# Patient Record
Sex: Male | Born: 1995 | Race: White | Hispanic: Yes | Marital: Single | Smoking: Former smoker
Health system: Southern US, Community
[De-identification: ages and names within clinical notes are randomized; demographics above are authoritative.]

## PROBLEM LIST (undated history)

## (undated) DIAGNOSIS — Z21 Asymptomatic human immunodeficiency virus [HIV] infection status: Secondary | ICD-10-CM

## (undated) DIAGNOSIS — B20 Human immunodeficiency virus [HIV] disease: Secondary | ICD-10-CM

---
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
## 2017-01-31 ENCOUNTER — Telehealth: Payer: Self-pay

## 2017-01-31 ENCOUNTER — Ambulatory Visit: Payer: Self-pay | Admitting: Internal Medicine

## 2017-01-31 NOTE — Telephone Encounter (Signed)
Called both numbers on file to contact patient regarding missed appointments. Several attempts made by [facility].  Patient may need some bridge counseling. Towanda OctaveLanatra Kaavya Puskarich, LPN

## 2017-04-04 ENCOUNTER — Other Ambulatory Visit: Payer: Self-pay

## 2017-04-04 ENCOUNTER — Other Ambulatory Visit: Payer: Self-pay | Admitting: Behavioral Health

## 2017-04-04 DIAGNOSIS — Z79899 Other long term (current) drug therapy: Secondary | ICD-10-CM

## 2017-04-04 DIAGNOSIS — Z113 Encounter for screening for infections with a predominantly sexual mode of transmission: Secondary | ICD-10-CM

## 2017-04-04 DIAGNOSIS — B20 Human immunodeficiency virus [HIV] disease: Secondary | ICD-10-CM

## 2017-04-05 LAB — COMPREHENSIVE METABOLIC PANEL
AG RATIO: 1.3 (calc) (ref 1.0–2.5)
ALBUMIN MSPROF: 4.7 g/dL (ref 3.6–5.1)
ALT: 14 U/L (ref 8–46)
AST: 19 U/L (ref 12–32)
Alkaline phosphatase (APISO): 66 U/L (ref 48–230)
BILIRUBIN TOTAL: 0.6 mg/dL (ref 0.2–1.1)
BUN / CREAT RATIO: 23 (calc) — AB (ref 6–22)
BUN: 22 mg/dL — AB (ref 7–20)
CALCIUM: 9.3 mg/dL (ref 8.9–10.4)
CO2: 23 mmol/L (ref 20–32)
Chloride: 100 mmol/L (ref 98–110)
Creat: 0.94 mg/dL (ref 0.60–1.26)
GLUCOSE: 84 mg/dL (ref 65–99)
Globulin: 3.6 g/dL (calc) — ABNORMAL HIGH (ref 2.1–3.5)
POTASSIUM: 3.8 mmol/L (ref 3.8–5.1)
SODIUM: 136 mmol/L (ref 135–146)
TOTAL PROTEIN: 8.3 g/dL — AB (ref 6.3–8.2)

## 2017-04-05 LAB — LIPID PANEL
CHOLESTEROL: 133 mg/dL (ref <170)
HDL: 66 mg/dL (ref >45)
LDL Cholesterol (Calc): 54 mg/dL (calc) (ref <110)
Non-HDL Cholesterol (Calc): 67 mg/dL (calc) (ref <120)
Total CHOL/HDL Ratio: 2 (calc) (ref <5.0)
Triglycerides: 45 mg/dL (ref <90)

## 2017-04-05 LAB — CBC WITH DIFFERENTIAL/PLATELET
BASOS ABS: 21 {cells}/uL (ref 0–200)
Basophils Relative: 0.3 %
EOS ABS: 57 {cells}/uL (ref 15–500)
Eosinophils Relative: 0.8 %
HEMATOCRIT: 45.6 % (ref 38.5–50.0)
HEMOGLOBIN: 16.1 g/dL (ref 13.2–17.1)
LYMPHS ABS: 1740 {cells}/uL (ref 850–3900)
MCH: 31.1 pg (ref 27.0–33.0)
MCHC: 35.3 g/dL (ref 32.0–36.0)
MCV: 88.2 fL (ref 80.0–100.0)
MPV: 11.7 fL (ref 7.5–12.5)
Monocytes Relative: 8.2 %
NEUTROS PCT: 66.2 %
Neutro Abs: 4700 cells/uL (ref 1500–7800)
Platelets: 196 10*3/uL (ref 140–400)
RBC: 5.17 10*6/uL (ref 4.20–5.80)
RDW: 13.2 % (ref 11.0–15.0)
Total Lymphocyte: 24.5 %
WBC mixed population: 582 cells/uL (ref 200–950)
WBC: 7.1 10*3/uL (ref 3.8–10.8)

## 2017-04-05 LAB — T-HELPER CELL (CD4) - (RCID CLINIC ONLY)
CD4 T CELL ABS: 430 /uL (ref 400–2700)
CD4 T CELL HELPER: 24 % — AB (ref 33–55)

## 2017-04-05 LAB — RPR: RPR: NONREACTIVE

## 2017-04-06 ENCOUNTER — Encounter: Payer: Self-pay | Admitting: Internal Medicine

## 2017-04-06 ENCOUNTER — Other Ambulatory Visit: Payer: Self-pay | Admitting: Internal Medicine

## 2017-04-06 LAB — HIV-1 RNA QUANT-NO REFLEX-BLD
HIV 1 RNA Quant: <20 copies/mL — AB
HIV-1 RNA Quant, Log: <1.3 Log copies/mL — AB

## 2017-04-06 LAB — URINE CYTOLOGY ANCILLARY ONLY
Chlamydia: POSITIVE — AB
NEISSERIA GONORRHEA: NEGATIVE

## 2017-04-06 LAB — CYTOLOGY, (ORAL, ANAL, URETHRAL) ANCILLARY ONLY
CHLAMYDIA, DNA PROBE: NEGATIVE
NEISSERIA GONORRHEA: NEGATIVE

## 2017-04-06 MED ORDER — AZITHROMYCIN 500 MG PO TABS: 1000.0000 mg | ORAL_TABLET | Freq: Once | ORAL | 0 refills | Status: AC

## 2017-04-08 ENCOUNTER — Telehealth: Payer: Self-pay | Admitting: *Deleted

## 2017-04-08 NOTE — Telephone Encounter (Signed)
Patient called for lab results. RN relayed Dr Ephriam Knucklesomer's message regarding chlamydia result and treatment, advised patient to let his partners know they should be tested/treated.  Azithromycin 1000 mg was sent to [facility] on [address] over the weekend.  Patient will pick up today. RN advised patient to keep his follow up appointment next week for full lab review with Dr Luciana Axeomer. Andree CossHowell, Emilija Bohman M, RN

## 2017-04-18 ENCOUNTER — Ambulatory Visit (INDEPENDENT_AMBULATORY_CARE_PROVIDER_SITE_OTHER): Payer: Self-pay | Admitting: Internal Medicine

## 2017-04-18 ENCOUNTER — Encounter: Payer: Self-pay | Admitting: Internal Medicine

## 2017-04-18 VITALS — BP 127/79 | HR 67 | Temp 98.5°F | Ht 68.0 in | Wt 135.0 lb

## 2017-04-18 DIAGNOSIS — B20 Human immunodeficiency virus [HIV] disease: Secondary | ICD-10-CM

## 2017-04-18 DIAGNOSIS — Z113 Encounter for screening for infections with a predominantly sexual mode of transmission: Secondary | ICD-10-CM

## 2017-04-18 DIAGNOSIS — R634 Abnormal weight loss: Secondary | ICD-10-CM

## 2017-04-18 DIAGNOSIS — Z5181 Encounter for therapeutic drug level monitoring: Secondary | ICD-10-CM

## 2017-04-18 MED ORDER — DOCUSATE SODIUM 100 MG PO CAPS: 100.0000 mg | ORAL_CAPSULE | Freq: Two times a day (BID) | ORAL | 2 refills | Status: DC

## 2017-04-18 NOTE — Assessment & Plan Note (Addendum)
Doing well on current regimen. He can rtc  Months.

## 2017-04-18 NOTE — Assessment & Plan Note (Signed)
+   chlamydia and he took the treatment I sent last week. Will rescreen now.

## 2017-04-18 NOTE — Progress Notes (Signed)
   Subjective:    Patient ID: Danny [name], male    DOB: 12-13-1995, 22 y.o.   MRN: [id number]  HPI Here for follow up of HIV He was a new patient last year and started on Biktarvy.  He was off about 2 months after his first month due to some confusion on getting it.  He though has been on it consistently since.  CD4 430 and viral load < 20.  He did have some burning with urination recently.  No current penile discharge.  + chlamydia.    Review of Systems  Constitutional: Negative for fatigue.  Skin: Negative for rash.  Neurological: Negative for dizziness.       Objective:   Physical Exam  Constitutional: He appears well-developed and well-nourished. No distress.  HENT:  Mouth/Throat: No oropharyngeal exudate.  Eyes: No scleral icterus.  Cardiovascular: Normal rate, regular rhythm and normal heart sounds.  No murmur heard. Pulmonary/Chest: Effort normal and breath sounds normal. No respiratory distress.  Skin: No rash noted.   SH: sexually active       Assessment & Plan:

## 2017-04-18 NOTE — Assessment & Plan Note (Signed)
Some more loss but able to eat.  I suspect he has some element of IBS.  Will try stool softener and see if that helps improve things.

## 2017-04-18 NOTE — Assessment & Plan Note (Signed)
Creat, LFTs wnl.  

## 2017-04-19 LAB — CYTOLOGY, (ORAL, ANAL, URETHRAL) ANCILLARY ONLY
CHLAMYDIA, DNA PROBE: NEGATIVE
Chlamydia: NEGATIVE
NEISSERIA GONORRHEA: NEGATIVE
Neisseria Gonorrhea: NEGATIVE

## 2017-04-19 LAB — URINE CYTOLOGY ANCILLARY ONLY
CHLAMYDIA, DNA PROBE: NEGATIVE
NEISSERIA GONORRHEA: NEGATIVE

## 2017-07-15 ENCOUNTER — Other Ambulatory Visit: Payer: Self-pay | Admitting: Pharmacist

## 2017-07-15 DIAGNOSIS — B20 Human immunodeficiency virus [HIV] disease: Secondary | ICD-10-CM

## 2017-07-15 MED ORDER — BICTEGRAVIR-EMTRICITAB-TENOFOV 50-200-25 MG PO TABS: 1.0000 | ORAL_TABLET | Freq: Every day | ORAL | 5 refills | Status: DC

## 2017-09-17 ENCOUNTER — Ambulatory Visit: Payer: Self-pay

## 2017-09-18 ENCOUNTER — Encounter: Payer: Self-pay | Admitting: Internal Medicine

## 2017-10-07 ENCOUNTER — Other Ambulatory Visit: Payer: Self-pay

## 2017-10-07 DIAGNOSIS — B20 Human immunodeficiency virus [HIV] disease: Secondary | ICD-10-CM

## 2017-10-09 LAB — T-HELPER CELL (CD4) - (RCID CLINIC ONLY)
CD4 % Helper T Cell: 32 % — ABNORMAL LOW (ref 33–55)
CD4 T CELL ABS: 760 /uL (ref 400–2700)

## 2017-10-09 LAB — HIV-1 RNA QUANT-NO REFLEX-BLD
HIV 1 RNA QUANT: DETECTED {copies}/mL — AB
HIV-1 RNA Quant, Log: <1.3 Log copies/mL — AB

## 2017-10-21 ENCOUNTER — Encounter: Payer: Self-pay | Admitting: Internal Medicine

## 2017-10-21 ENCOUNTER — Ambulatory Visit (INDEPENDENT_AMBULATORY_CARE_PROVIDER_SITE_OTHER): Payer: Self-pay | Admitting: Internal Medicine

## 2017-10-21 VITALS — BP 145/78 | HR 80 | Temp 97.7°F | Wt 140.0 lb

## 2017-10-21 DIAGNOSIS — B20 Human immunodeficiency virus [HIV] disease: Secondary | ICD-10-CM

## 2017-10-21 DIAGNOSIS — Z113 Encounter for screening for infections with a predominantly sexual mode of transmission: Secondary | ICD-10-CM

## 2017-10-21 DIAGNOSIS — Z7185 Encounter for immunization safety counseling: Secondary | ICD-10-CM

## 2017-10-21 DIAGNOSIS — Z23 Encounter for immunization: Secondary | ICD-10-CM

## 2017-10-21 DIAGNOSIS — Z79899 Other long term (current) drug therapy: Secondary | ICD-10-CM

## 2017-10-21 DIAGNOSIS — Z720 Tobacco use: Secondary | ICD-10-CM

## 2017-10-21 DIAGNOSIS — Z7189 Other specified counseling: Secondary | ICD-10-CM

## 2017-10-21 MED ORDER — DOCUSATE SODIUM 100 MG PO CAPS: 100.0000 mg | ORAL_CAPSULE | Freq: Two times a day (BID) | ORAL | 5 refills | Status: DC

## 2017-10-21 NOTE — Progress Notes (Signed)
   Subjective:    Patient ID: Danny [name], male    DOB: 12-13-1995, 22 y.o.   MRN: [id number]  HPI Here for follow up of HIV He was a new patient last year and started on Biktarvy.  CD4 760 and viral load < 20.  No new issues.  No associated n/v/d.  He is interested in hormone therapy and asking if there are interactions with his medications.  He does smoke occasionally.   Review of Systems  Constitutional: Negative for fatigue.  Skin: Negative for rash.  Neurological: Negative for dizziness.       Objective:   Physical Exam  Constitutional: He appears well-developed and well-nourished. No distress.  HENT:  Mouth/Throat: No oropharyngeal exudate.  Eyes: No scleral icterus.  Cardiovascular: Normal rate, regular rhythm and normal heart sounds.  No murmur heard. Pulmonary/Chest: Effort normal and breath sounds normal. No respiratory distress.  Skin: No rash noted.   SH: sexually active, some tobacco use       Assessment & Plan:

## 2017-10-22 DIAGNOSIS — Z7189 Other specified counseling: Secondary | ICD-10-CM | POA: Insufficient documentation

## 2017-10-22 DIAGNOSIS — Z7185 Encounter for immunization safety counseling: Secondary | ICD-10-CM | POA: Insufficient documentation

## 2017-10-22 NOTE — Assessment & Plan Note (Signed)
I discussed the HPV vaccine and started the series today.  He will return with a nurse visit in 1 to 2 months for #2 and I will do the third 1 at his next visit.

## 2017-10-22 NOTE — Assessment & Plan Note (Signed)
He was counseled on Prevnar and the flu shot and both were given today.

## 2017-10-22 NOTE — Assessment & Plan Note (Addendum)
His HIV continues to be well controlled and no missed doses.  He will continue with Biktarvy and return in 6 months. He is pursuing hormone therapy with another provider and no interaction with his current medications.

## 2017-10-22 NOTE — Assessment & Plan Note (Signed)
I advised against using cigarettes if he is to pursue hormone therapy.  Also for his health reasons.

## 2018-02-10 ENCOUNTER — Ambulatory Visit: Payer: Self-pay

## 2018-02-10 ENCOUNTER — Other Ambulatory Visit: Payer: Self-pay | Admitting: Internal Medicine

## 2018-02-10 ENCOUNTER — Ambulatory Visit: Payer: Self-pay | Admitting: Internal Medicine

## 2018-02-10 DIAGNOSIS — B20 Human immunodeficiency virus [HIV] disease: Secondary | ICD-10-CM

## 2018-02-19 ENCOUNTER — Ambulatory Visit: Payer: Self-pay

## 2018-02-20 ENCOUNTER — Ambulatory Visit: Payer: Self-pay

## 2018-02-24 ENCOUNTER — Encounter: Payer: Self-pay | Admitting: Internal Medicine

## 2018-03-20 ENCOUNTER — Telehealth: Payer: Self-pay

## 2018-03-20 ENCOUNTER — Other Ambulatory Visit: Payer: Self-pay | Admitting: Internal Medicine

## 2018-03-20 DIAGNOSIS — Z113 Encounter for screening for infections with a predominantly sexual mode of transmission: Secondary | ICD-10-CM

## 2018-03-20 NOTE — Telephone Encounter (Signed)
Patient called office today requesting an appointment for STI testing. Patient states that he has had sore throat,diarrhea, and penile discharge (clear) for two weeks. Patient is scheduled to come into office on 2/24.  Danny [name], [place]

## 2018-03-20 NOTE — Telephone Encounter (Signed)
Done. thanks

## 2018-03-20 NOTE — Telephone Encounter (Signed)
See if he will come in asap and get testing done and empiric treatment with azithromycin 1 gram and 250 mg IM ceftriaxone.

## 2018-03-20 NOTE — Telephone Encounter (Signed)
Per Dr. Luciana Axe called patient to see if he would be able to come in before Monday appt for STI testing/treatment. Patient states that he can come in tomorrow 2/21 at 9:30. Patient is aware that he will receive 1 gram azithromycin and 250 mg IM ceftfiraxone. Patient will also need to have oral/ rectal swab, urine cytology, and RPR done at visit.  Lorenso Courier, [place]

## 2018-03-21 ENCOUNTER — Ambulatory Visit (INDEPENDENT_AMBULATORY_CARE_PROVIDER_SITE_OTHER): Payer: Self-pay

## 2018-03-21 ENCOUNTER — Other Ambulatory Visit: Payer: Self-pay

## 2018-03-21 DIAGNOSIS — Z113 Encounter for screening for infections with a predominantly sexual mode of transmission: Secondary | ICD-10-CM

## 2018-03-21 DIAGNOSIS — B20 Human immunodeficiency virus [HIV] disease: Secondary | ICD-10-CM

## 2018-03-21 DIAGNOSIS — A64 Unspecified sexually transmitted disease: Secondary | ICD-10-CM

## 2018-03-21 MED ORDER — AZITHROMYCIN 250 MG PO TABS
1000.0000 mg | ORAL_TABLET | Freq: Once | ORAL | Status: AC
  Administered 2018-03-21: 1000 mg via ORAL

## 2018-03-21 MED ORDER — CEFTRIAXONE SODIUM 250 MG IJ SOLR
250.0000 mg | Freq: Once | INTRAMUSCULAR | Status: AC
  Administered 2018-03-21: 250 mg via INTRAMUSCULAR

## 2018-03-21 NOTE — Progress Notes (Signed)
Patient came into clinic today for testing/treatment for STI exposure. Patient tolerated antibiotics well. Did not have any questions/concerns about treatment. Patient was advised to use tylenol/ ibuprofen for any soreness.  Verbalized to patient that he must inform recent partners to get tested/treated at local health department. Also to refrain from any sexual activity to avoid reinfection. Advised to patient if he is to engage in sexual activity to make sure he use condoms to avoid reinfection. Lorenso Courier, [place]

## 2018-03-24 ENCOUNTER — Ambulatory Visit: Payer: Self-pay | Admitting: Internal Medicine

## 2018-03-24 LAB — CYTOLOGY, (ORAL, ANAL, URETHRAL) ANCILLARY ONLY
CHLAMYDIA, DNA PROBE: POSITIVE — AB
Chlamydia: POSITIVE — AB
Neisseria Gonorrhea: NEGATIVE
Neisseria Gonorrhea: NEGATIVE

## 2018-03-24 LAB — RPR: RPR Ser Ql: NONREACTIVE

## 2018-03-24 LAB — URINE CYTOLOGY ANCILLARY ONLY
Chlamydia: POSITIVE — AB
Neisseria Gonorrhea: NEGATIVE

## 2018-03-26 NOTE — Progress Notes (Addendum)
Noted positive Chlamydia urine cytology, oral, and rectal swabs. Patient appropriately treated on 03/21/2018 Valarie Cones, LPN

## 2018-04-09 ENCOUNTER — Other Ambulatory Visit: Payer: Self-pay

## 2018-04-09 DIAGNOSIS — B20 Human immunodeficiency virus [HIV] disease: Secondary | ICD-10-CM

## 2018-04-09 DIAGNOSIS — Z113 Encounter for screening for infections with a predominantly sexual mode of transmission: Secondary | ICD-10-CM

## 2018-04-09 DIAGNOSIS — Z79899 Other long term (current) drug therapy: Secondary | ICD-10-CM

## 2018-04-10 LAB — T-HELPER CELL (CD4) - (RCID CLINIC ONLY)
CD4 % Helper T Cell: 30 % — ABNORMAL LOW (ref 33–55)
CD4 T Cell Abs: 860 /uL (ref 400–2700)

## 2018-04-11 ENCOUNTER — Other Ambulatory Visit: Payer: Self-pay | Admitting: Internal Medicine

## 2018-04-11 DIAGNOSIS — B20 Human immunodeficiency virus [HIV] disease: Secondary | ICD-10-CM

## 2018-04-11 LAB — CBC WITH DIFFERENTIAL/PLATELET
Absolute Monocytes: 765 cells/uL (ref 200–950)
Basophils Absolute: 27 cells/uL (ref 0–200)
Basophils Relative: 0.3 %
Eosinophils Absolute: 36 cells/uL (ref 15–500)
Eosinophils Relative: 0.4 %
HCT: 44.3 % (ref 38.5–50.0)
Hemoglobin: 15.2 g/dL (ref 13.2–17.1)
Lymphs Abs: 2943 cells/uL (ref 850–3900)
MCH: 31.4 pg (ref 27.0–33.0)
MCHC: 34.3 g/dL (ref 32.0–36.0)
MCV: 91.5 fL (ref 80.0–100.0)
MPV: 11.6 fL (ref 7.5–12.5)
Monocytes Relative: 8.5 %
NEUTROS ABS: 5229 {cells}/uL (ref 1500–7800)
Neutrophils Relative %: 58.1 %
Platelets: 178 10*3/uL (ref 140–400)
RBC: 4.84 10*6/uL (ref 4.20–5.80)
RDW: 13 % (ref 11.0–15.0)
Total Lymphocyte: 32.7 %
WBC: 9 10*3/uL (ref 3.8–10.8)

## 2018-04-11 LAB — LIPID PANEL
Cholesterol: 136 mg/dL (ref <200)
HDL: 62 mg/dL (ref >=40)
LDL CHOLESTEROL (CALC): 62 mg/dL
Non-HDL Cholesterol (Calc): 74 mg/dL (calc) (ref <130)
Total CHOL/HDL Ratio: 2.2 (calc) (ref <5.0)
Triglycerides: 41 mg/dL (ref <150)

## 2018-04-11 LAB — COMPLETE METABOLIC PANEL WITH GFR
AG Ratio: 1.5 (calc) (ref 1.0–2.5)
ALT: 11 U/L (ref 9–46)
AST: 19 U/L (ref 10–40)
Albumin: 4.8 g/dL (ref 3.6–5.1)
Alkaline phosphatase (APISO): 64 U/L (ref 36–130)
BUN: 16 mg/dL (ref 7–25)
CO2: 26 mmol/L (ref 20–32)
Calcium: 9.8 mg/dL (ref 8.6–10.3)
Chloride: 103 mmol/L (ref 98–110)
Creat: 0.86 mg/dL (ref 0.60–1.35)
GFR, Est African American: 145 mL/min/{1.73_m2} (ref >=60)
GFR, Est Non African American: 125 mL/min/{1.73_m2} (ref >=60)
GLOBULIN: 3.1 g/dL (ref 1.9–3.7)
Glucose, Bld: 79 mg/dL (ref 65–99)
Potassium: 3.9 mmol/L (ref 3.5–5.3)
Sodium: 137 mmol/L (ref 135–146)
Total Bilirubin: 0.6 mg/dL (ref 0.2–1.2)
Total Protein: 7.9 g/dL (ref 6.1–8.1)

## 2018-04-11 LAB — HIV-1 RNA QUANT-NO REFLEX-BLD
HIV 1 RNA Quant: <20 copies/mL
HIV-1 RNA Quant, Log: <1.3 Log copies/mL

## 2018-04-11 LAB — RPR: RPR Ser Ql: NONREACTIVE

## 2018-04-23 ENCOUNTER — Encounter: Payer: Self-pay | Admitting: Internal Medicine

## 2018-04-23 ENCOUNTER — Ambulatory Visit (INDEPENDENT_AMBULATORY_CARE_PROVIDER_SITE_OTHER): Payer: Self-pay | Admitting: Internal Medicine

## 2018-04-23 ENCOUNTER — Other Ambulatory Visit: Payer: Self-pay

## 2018-04-23 VITALS — BP 127/69 | HR 71 | Temp 98.2°F | Ht 67.0 in | Wt 141.0 lb

## 2018-04-23 DIAGNOSIS — Z113 Encounter for screening for infections with a predominantly sexual mode of transmission: Secondary | ICD-10-CM

## 2018-04-23 DIAGNOSIS — Z5181 Encounter for therapeutic drug level monitoring: Secondary | ICD-10-CM

## 2018-04-23 DIAGNOSIS — Z7185 Encounter for immunization safety counseling: Secondary | ICD-10-CM

## 2018-04-23 DIAGNOSIS — A749 Chlamydial infection, unspecified: Secondary | ICD-10-CM

## 2018-04-23 DIAGNOSIS — Z23 Encounter for immunization: Secondary | ICD-10-CM

## 2018-04-23 DIAGNOSIS — Z7189 Other specified counseling: Secondary | ICD-10-CM

## 2018-04-23 DIAGNOSIS — B20 Human immunodeficiency virus [HIV] disease: Secondary | ICD-10-CM

## 2018-04-24 MED ORDER — AZITHROMYCIN 250 MG PO TABS
1000.0000 mg | ORAL_TABLET | Freq: Once | ORAL | Status: AC
  Administered 2018-04-23: 1000 mg via ORAL

## 2018-04-24 NOTE — Assessment & Plan Note (Signed)
Discussed vaccine and #2 given today.  Will do #3 next visit.

## 2018-04-24 NOTE — Assessment & Plan Note (Signed)
Doing well, no issues.  rtc 6 months.  

## 2018-04-24 NOTE — Progress Notes (Signed)
   Subjective:    Patient ID: Danny [name], male    DOB: 1995/10/09, 23 y.o.   MRN: [id number]  HPI Here for follow up of HIV He was a new patient last year and started on Biktarvy.  CD4 860 and viral load < 20.  No new issues.  No associated n/v/d.  No rashes. Treated for chlamydia but concerned about reinfection with same partner.   Review of Systems  Constitutional: Negative for fatigue.  Skin: Negative for rash.  Neurological: Negative for dizziness.       Objective:   Physical Exam  Constitutional: He appears well-developed and well-nourished. No distress.  HENT:  Mouth/Throat: No oropharyngeal exudate.  Eyes: No scleral icterus.  Cardiovascular: Normal rate, regular rhythm and normal heart sounds.  No murmur heard. Pulmonary/Chest: Effort normal and breath sounds normal. No respiratory distress.  Skin: No rash noted.   SH: sexually active, some tobacco use       Assessment & Plan:

## 2018-04-24 NOTE — Assessment & Plan Note (Signed)
Normal creat, LFTs 

## 2018-04-24 NOTE — Assessment & Plan Note (Signed)
Screened.  Will retreat empirically for chlamydia.  Condoms offered.

## 2018-06-04 ENCOUNTER — Other Ambulatory Visit: Payer: Self-pay | Admitting: Internal Medicine

## 2018-06-04 DIAGNOSIS — B20 Human immunodeficiency virus [HIV] disease: Secondary | ICD-10-CM

## 2018-10-23 ENCOUNTER — Other Ambulatory Visit: Payer: Self-pay

## 2018-11-03 ENCOUNTER — Other Ambulatory Visit: Payer: Self-pay | Admitting: Internal Medicine

## 2018-11-03 DIAGNOSIS — B20 Human immunodeficiency virus [HIV] disease: Secondary | ICD-10-CM

## 2018-11-04 ENCOUNTER — Telehealth: Payer: Self-pay | Admitting: Pharmacy Technician

## 2018-11-04 ENCOUNTER — Telehealth: Payer: Self-pay

## 2018-11-04 ENCOUNTER — Encounter: Payer: Self-pay | Admitting: Internal Medicine

## 2018-11-04 NOTE — Telephone Encounter (Signed)
Pharmacy called office today stating patient's ADAP has expired. Patient is unable to get medication until this is renewed. Will forward message to [facility], Development worker, community. [place]

## 2018-11-04 NOTE — Telephone Encounter (Signed)
ADAP Application completed and sent to [facility]. Talking with pharmacy to see if we can get medication assistance until ADAP is approved

## 2018-11-04 NOTE — Telephone Encounter (Addendum)
[facility] Patient Advocate Encounter  Completed and sent [employer] Advancing Access application for [place] for this patient who is uninsured.    Patient is approved 11/04/2018 through 11/04/2019.  BIN      [id number] PCN    [id number] GRP    [id number] ID        [id number]  This is a bridge to HMAP approval.   Venida Jarvis. Nadara Mustard [place] Patient [facility] Phone: [phone] Fax:  [phone]

## 2018-11-12 ENCOUNTER — Other Ambulatory Visit: Payer: Self-pay

## 2018-11-12 DIAGNOSIS — B20 Human immunodeficiency virus [HIV] disease: Secondary | ICD-10-CM

## 2018-11-13 LAB — T-HELPER CELL (CD4) - (RCID CLINIC ONLY)
CD4 % Helper T Cell: 31 % — ABNORMAL LOW (ref 33–65)
CD4 T Cell Abs: 605 /uL (ref 400–1790)

## 2018-11-15 LAB — HIV-1 RNA QUANT-NO REFLEX-BLD
HIV 1 RNA Quant: <20 copies/mL
HIV-1 RNA Quant, Log: <1.3 Log copies/mL

## 2018-11-17 ENCOUNTER — Encounter: Payer: Self-pay | Admitting: Internal Medicine

## 2018-11-26 ENCOUNTER — Ambulatory Visit: Payer: Self-pay | Admitting: Internal Medicine

## 2018-12-16 ENCOUNTER — Ambulatory Visit: Payer: Self-pay | Admitting: Internal Medicine

## 2019-03-04 ENCOUNTER — Ambulatory Visit: Payer: Self-pay

## 2019-03-04 ENCOUNTER — Other Ambulatory Visit: Payer: Self-pay

## 2019-03-04 ENCOUNTER — Encounter: Payer: Self-pay | Admitting: Internal Medicine

## 2019-03-04 ENCOUNTER — Ambulatory Visit (INDEPENDENT_AMBULATORY_CARE_PROVIDER_SITE_OTHER): Payer: Self-pay | Admitting: Internal Medicine

## 2019-03-04 VITALS — BP 130/80 | HR 70 | Temp 98.2°F | Ht 67.0 in | Wt 138.0 lb

## 2019-03-04 DIAGNOSIS — B20 Human immunodeficiency virus [HIV] disease: Secondary | ICD-10-CM

## 2019-03-04 DIAGNOSIS — Z113 Encounter for screening for infections with a predominantly sexual mode of transmission: Secondary | ICD-10-CM

## 2019-03-04 DIAGNOSIS — Z7185 Encounter for immunization safety counseling: Secondary | ICD-10-CM

## 2019-03-04 DIAGNOSIS — Z7189 Other specified counseling: Secondary | ICD-10-CM

## 2019-03-04 DIAGNOSIS — Z23 Encounter for immunization: Secondary | ICD-10-CM

## 2019-03-04 NOTE — Assessment & Plan Note (Signed)
Doing well on Biktarvy.  He prefers to do labs at the time of his visit with me so will do labs at the same time next visit.  rtc 6 months.

## 2019-03-04 NOTE — Assessment & Plan Note (Signed)
Discussed HPV and #3 given today 

## 2019-03-04 NOTE — Progress Notes (Signed)
   Subjective:    Patient ID: Danny [name], male    DOB: Feb 25, 1995, 24 y.o.   MRN: [id number]  HPI Here for follow up of HIV He continues on Biktarvy and no missed doses.  CD4 of 605, viral load < 20.  No complaints.  Due for HPV #3.  No associated n/v/d   Review of Systems  Constitutional: Negative for fatigue.  Skin: Negative for rash.  Neurological: Negative for dizziness.       Objective:   Physical Exam  Constitutional: He appears well-developed and well-nourished. No distress.  Eyes: No scleral icterus.  Cardiovascular: Normal rate, regular rhythm and normal heart sounds.  Pulmonary/Chest: Effort normal and breath sounds normal. No respiratory distress.  Skin: No rash noted.   SH: sexually active, some tobacco use       Assessment & Plan:

## 2019-03-04 NOTE — Assessment & Plan Note (Signed)
Will screen today 

## 2019-03-06 ENCOUNTER — Telehealth: Payer: Self-pay

## 2019-03-06 ENCOUNTER — Encounter: Payer: Self-pay | Admitting: Internal Medicine

## 2019-03-06 LAB — CYTOLOGY, (ORAL, ANAL, URETHRAL) ANCILLARY ONLY
Chlamydia: NEGATIVE
Chlamydia: POSITIVE — AB
Comment: NEGATIVE
Comment: NEGATIVE
Comment: NORMAL
Comment: NORMAL
Neisseria Gonorrhea: NEGATIVE
Neisseria Gonorrhea: POSITIVE — AB

## 2019-03-06 NOTE — Telephone Encounter (Signed)
-----   Message from Gardiner Barefoot, MD sent at 03/06/2019  1:57 PM EST ----- Positive GC and chlamydia.  Needs 250 IM ceftriaxone and 1000 mg azithromycin. thanks

## 2019-03-06 NOTE — Telephone Encounter (Signed)
Patient scheduled for nurse visit 03/09/19 for treatment.   Lena Fieldhouse Loyola Mast, RN

## 2019-03-09 ENCOUNTER — Other Ambulatory Visit: Payer: Self-pay

## 2019-03-09 ENCOUNTER — Ambulatory Visit (INDEPENDENT_AMBULATORY_CARE_PROVIDER_SITE_OTHER): Payer: Self-pay

## 2019-03-09 DIAGNOSIS — A549 Gonococcal infection, unspecified: Secondary | ICD-10-CM

## 2019-03-09 DIAGNOSIS — A749 Chlamydial infection, unspecified: Secondary | ICD-10-CM

## 2019-03-09 MED ORDER — CEFTRIAXONE SODIUM 250 MG IJ SOLR
250.0000 mg | Freq: Once | INTRAMUSCULAR | Status: AC
  Administered 2019-03-09: 16:00:00 250 mg via INTRAMUSCULAR

## 2019-03-09 MED ORDER — AZITHROMYCIN 250 MG PO TABS
1000.0000 mg | ORAL_TABLET | Freq: Once | ORAL | Status: AC
  Administered 2019-03-09: 16:00:00 1000 mg via ORAL

## 2019-03-31 ENCOUNTER — Other Ambulatory Visit: Payer: Self-pay | Admitting: Internal Medicine

## 2019-03-31 DIAGNOSIS — B20 Human immunodeficiency virus [HIV] disease: Secondary | ICD-10-CM

## 2019-04-03 ENCOUNTER — Other Ambulatory Visit: Payer: Self-pay | Admitting: *Deleted

## 2019-04-03 DIAGNOSIS — B20 Human immunodeficiency virus [HIV] disease: Secondary | ICD-10-CM

## 2019-04-03 MED ORDER — BIKTARVY 50-200-25 MG PO TABS: 1.0000 | ORAL_TABLET | Freq: Every day | ORAL | 4 refills | Status: DC

## 2019-04-07 ENCOUNTER — Telehealth: Payer: Self-pay

## 2019-04-07 NOTE — Telephone Encounter (Signed)
Received patient advise request from patient requesting appointment with Md.  Message states"I haven't been feeling good lately for Almost 1 week, I honestly just wanna make sure medication is working. Symptoms are diarrhea, my throat hurts and my balls hurt a lot. Pls reach me  at [phone] "  Patient has not had a covid test. Will forward message to MD to advise on appt. Lorenso Courier, [place]

## 2019-04-07 NOTE — Telephone Encounter (Signed)
Have him get a COVID test and if negative, can schedule him with someone. thanks

## 2019-04-07 NOTE — Telephone Encounter (Signed)
Patient called triage to report not feeling well. Reports that he has been experiencing testicular pain and discomfort for the last few days. Did receive treatment for gonorrhea and chlamydia a few weeks ago but doesn't feel it has helped. States he hasn't felt well for over a week and previous reported symptoms of "sore throat and diarrhea" were made up in order to be seen sooner. Patient says that he has not been sexually active with anyone aside from his 1 partner. Scheduled patient to see Rexene Alberts on 15th. Will message patient to go to urgent care if symptoms become too severe.   Jelitza Manninen Loyola Mast, RN

## 2019-04-10 ENCOUNTER — Telehealth: Payer: Self-pay

## 2019-04-10 NOTE — Telephone Encounter (Signed)
COVID-19 Pre-Screening Questions:04/10/19  Do you currently have a fever (>100 F), chills or unexplained body aches?NO   Are you currently experiencing new cough, shortness of breath, sore throat, runny nose? NO .  Have you recently travelled outside the state of [place] in the last 14 days? NO  .  Have you been in contact with someone that is currently pending confirmation of Covid19 testing or has been confirmed to have the Covid19 virus?  NO   **If the patient answers NO to ALL questions -  advise the patient to please call the clinic before coming to the office should any symptoms develop.     

## 2019-04-13 ENCOUNTER — Ambulatory Visit: Payer: Self-pay | Admitting: Infectious Diseases

## 2019-04-14 ENCOUNTER — Other Ambulatory Visit: Payer: Self-pay

## 2019-04-14 ENCOUNTER — Telehealth: Payer: Self-pay

## 2019-04-14 ENCOUNTER — Encounter: Payer: Self-pay | Admitting: Infectious Diseases

## 2019-04-14 ENCOUNTER — Ambulatory Visit (INDEPENDENT_AMBULATORY_CARE_PROVIDER_SITE_OTHER): Payer: Self-pay | Admitting: Infectious Diseases

## 2019-04-14 DIAGNOSIS — N50819 Testicular pain, unspecified: Secondary | ICD-10-CM | POA: Insufficient documentation

## 2019-04-14 DIAGNOSIS — B20 Human immunodeficiency virus [HIV] disease: Secondary | ICD-10-CM

## 2019-04-14 MED ORDER — CEFTRIAXONE SODIUM 500 MG IJ SOLR
500.0000 mg | Freq: Once | INTRAMUSCULAR | Status: AC
  Administered 2019-04-14: 500 mg via INTRAMUSCULAR

## 2019-04-14 MED ORDER — DOXYCYCLINE HYCLATE 100 MG PO TABS: 100.0000 mg | ORAL_TABLET | Freq: Two times a day (BID) | ORAL | 0 refills | Status: AC

## 2019-04-14 NOTE — Assessment & Plan Note (Signed)
Continues on [place] once a day - well controlled with labs in October 2020 indicating VL < 20.  Follow up as scheduled with Dr. Luciana Axe.

## 2019-04-14 NOTE — Telephone Encounter (Signed)
Patient called office today to follow up on Doxy that was prescribed today. Would like to know if he should start today and take two or start tomorrow AM. Advised patient not to double dose today and start medication tomorrow as directed. Will call if he has any questions. Danny [name], [place]

## 2019-04-14 NOTE — Assessment & Plan Note (Signed)
Treated with 250 mg Ceftriaxone + 1 gm Azithromycin recently. May have been re-exposed from boyfriend prior to when he was treated. Alternatively may have been under dosed - will proceed with newly released guidelines and treat with 500 mg IM Ceftriaxone and use doxycycline 100 mg PO BID x 7days for rectal chlamydia.   Asked him to abstain for 14 days. Partner may need repeat treatment as well.   Return if symptoms fail to improve.

## 2019-04-14 NOTE — Patient Instructions (Addendum)
We re-treated you today with higher dose antibiotic injection and sent in a new pill to try called Doxycycline - 1 pill twice a day for 7 days.   Your partner may need treatment again as well.   No sexual contact of any time for 14 days.

## 2019-04-14 NOTE — Progress Notes (Signed)
Name: Danny [name]  DOB: 1995-04-24 MRN: [id number] PCP: Patient, No Pcp Per    Patient Active Problem List   Diagnosis Date Noted  . Testicular pain 04/14/2019  . HPV vaccine counseling 10/22/2017  . Medication monitoring encounter 09/18/2016  . Weight loss 09/18/2016  . Tobacco abuse 06/18/2016  . Human immunodeficiency virus (HIV) disease (HCC) 05/31/2016  . Screening examination for venereal disease 05/31/2016  . Encounter for long-term (current) use of high-risk medication 05/31/2016      Subjective:  CC: Ongoing testicular pain / abdominal pain  Feels like he was re-exposed from partner and he may need treatment again for gonorrhea/chlamydia.     HPI: Was treated for gonorrhea (pharyngeal) and chlamydia (rectal) in February 2021. His boyfriend was treated as well but they did have sex in between that may have resulted in re-exposure.  Having some lower abdominal pain and scrotal pain. No pain with erection/ejaculation.   Also recently started hormone therapy with estradiol 0.5 mg BID and [facility] Unclear what goals are at this time and no preference in particular with pronouns.   Review of Systems  Constitutional: Negative for chills and fever.  HENT: Negative for sore throat.   Eyes: Negative for visual disturbance.  Gastrointestinal: Positive for abdominal pain. Negative for anal bleeding and rectal pain.  Genitourinary: Positive for testicular pain. Negative for discharge, dysuria, genital sores, penile pain and scrotal swelling.  Musculoskeletal: Negative for arthralgias and joint swelling.  Skin: Negative for rash.  Neurological: Negative for headaches.  Hematological: Negative for adenopathy.     Past Medical History:  Diagnosis Date  . HIV (human immunodeficiency virus infection) (HCC)     Outpatient Medications Prior to Visit  Medication Sig Dispense Refill  . bictegravir-emtricitabine-tenofovir AF  (BIKTARVY) 50-200-25 MG TABS tablet Take 1 tablet by mouth daily. 30 tablet 4  . estradiol (ESTRACE) 1 MG tablet Take 0.5 mg by mouth daily.    Marland Kitchen spironolactone (ALDACTONE) 100 MG tablet Take 100 mg by mouth daily.     No facility-administered medications prior to visit.     No Known Allergies  Social History   Tobacco Use  . Smoking status: Former Smoker    Types: Cigars  . Smokeless tobacco: Never Used  Substance Use Topics  . Alcohol use: Yes    Comment: occasional  . Drug use: Yes    Types: Marijuana    Comment: occasional    History reviewed. No pertinent family history.  Social History   Substance and Sexual Activity  Sexual Activity Yes  . Partners: Male, Male     Objective:   Vitals:   04/14/19 1454  BP: 125/70  Pulse: 69  Temp: 97.9 F (36.6 C)  SpO2: 99%  Weight: 138 lb (62.6 kg)  Height: 5\' 7"  (1.702 m)   Body mass index is 21.61 kg/m.  Physical Exam Vitals and nursing note reviewed.  HENT:     Mouth/Throat:     Mouth: No oral lesions.     Dentition: Normal dentition. No dental caries.  Eyes:     General: No scleral icterus. Cardiovascular:     Rate and Rhythm: Normal rate and regular rhythm.     Heart sounds: Normal heart sounds.  Pulmonary:     Effort: Pulmonary effort is normal.     Breath sounds: Normal breath sounds.  Abdominal:     General: There is no distension.     Palpations: Abdomen is soft.  Tenderness: There is no abdominal tenderness.  Genitourinary:    Comments: Exam deferred  Lymphadenopathy:     Cervical: No cervical adenopathy.  Skin:    General: Skin is warm and dry.     Findings: No rash.  Neurological:     Mental Status: He is alert and oriented to person, place, and time.     Lab Results Lab Results  Component Value Date   WBC 9.0 04/09/2018   HGB 15.2 04/09/2018   HCT 44.3 04/09/2018   MCV 91.5 04/09/2018   PLT 178 04/09/2018    Lab Results  Component Value Date   CREATININE 0.86 04/09/2018    BUN 16 04/09/2018   NA 137 04/09/2018   K 3.9 04/09/2018   CL 103 04/09/2018   CO2 26 04/09/2018    Lab Results  Component Value Date   ALT 11 04/09/2018   AST 19 04/09/2018   ALKPHOS 59 09/18/2016   BILITOT 0.6 04/09/2018    Lab Results  Component Value Date   CHOL 136 04/09/2018   HDL 62 04/09/2018   LDLCALC 62 04/09/2018   TRIG 41 04/09/2018   CHOLHDL 2.2 04/09/2018   HIV 1 RNA Quant (copies/mL)  Date Value  11/12/2018 <20 NOT DETECTED  04/09/2018 <20 NOT DETECTED  10/07/2017 <20 DETECTED (A)   CD4 T Cell Abs (/uL)  Date Value  11/12/2018 605  04/09/2018 860  10/07/2017 760     Assessment & Plan:   Problem List Items Addressed This Visit      Unprioritized   Testicular pain    Treated with 250 mg Ceftriaxone + 1 gm Azithromycin recently. May have been re-exposed from boyfriend prior to when he was treated. Alternatively may have been under dosed - will proceed with newly released guidelines and treat with 500 mg IM Ceftriaxone and use doxycycline 100 mg PO BID x 7days for rectal chlamydia.   Asked him to abstain for 14 days. Partner may need repeat treatment as well.   Return if symptoms fail to improve.       Human immunodeficiency virus (HIV) disease ([place]) (Chronic)    Continues on Biktarvy once a day - well controlled with labs in October 2020 indicating VL < 20.  Follow up as scheduled with Dr. Linus Salmons.          Janene Madeira, MSN, NP-C [facility] [place] Pager: [phone] Office: [phone]  04/14/19  4:20 PM

## 2019-04-20 ENCOUNTER — Ambulatory Visit: Payer: Self-pay | Admitting: Osteopathic Medicine

## 2019-04-22 ENCOUNTER — Ambulatory Visit: Payer: Self-pay | Admitting: Internal Medicine

## 2019-05-07 ENCOUNTER — Encounter (HOSPITAL_BASED_OUTPATIENT_CLINIC_OR_DEPARTMENT_OTHER): Payer: Self-pay | Admitting: *Deleted

## 2019-05-07 ENCOUNTER — Other Ambulatory Visit: Payer: Self-pay

## 2019-05-07 ENCOUNTER — Emergency Department (HOSPITAL_BASED_OUTPATIENT_CLINIC_OR_DEPARTMENT_OTHER)
Admission: EM | Admit: 2019-05-07 | Discharge: 2019-05-07 | Disposition: A | Discharge status: Home / Self Care | Payer: Self-pay | Attending: Emergency Medicine | Admitting: Emergency Medicine

## 2019-05-07 DIAGNOSIS — Z79899 Other long term (current) drug therapy: Secondary | ICD-10-CM | POA: Insufficient documentation

## 2019-05-07 DIAGNOSIS — L299 Pruritus, unspecified: Secondary | ICD-10-CM | POA: Insufficient documentation

## 2019-05-07 DIAGNOSIS — Z87891 Personal history of nicotine dependence: Secondary | ICD-10-CM | POA: Insufficient documentation

## 2019-05-07 DIAGNOSIS — Z21 Asymptomatic human immunodeficiency virus [HIV] infection status: Secondary | ICD-10-CM | POA: Insufficient documentation

## 2019-05-07 MED ORDER — HYDROXYZINE HCL 25 MG PO TABS: 25.0000 mg | ORAL_TABLET | Freq: Four times a day (QID) | ORAL | 0 refills | Status: DC

## 2019-05-07 NOTE — ED Provider Notes (Signed)
[facility] Provider Note   CSN: [id number] Arrival date & time: 05/07/19  2054     History Chief Complaint  Patient presents with  . Insect Bite    Danny [name] is a 24 y.o. male with PMHx HIV who presents to the ED with concern for possible insect bite x 6 days.  Patient endorses he recently received a package from an Database administrator [place] and shortly after putting on those clothes he began having diffuse itching.  Patient states he saw a video online of another individual who bought from the same company who was diagnosed with scabies recently.  Patient is concerned that he could have scabies.  Has not taken anything for his symptoms.  He has not seen any obvious bug bites.  He lives with his boyfriend and states that his boyfriend is not having any symptoms.  Patient denies any new hygiene products, foods, environments.  Denies shortness of breath, difficulty swallowing, facial swelling, tongue swelling, lip swelling, any other associated symptoms.   Last viral load < 18 November 2018  The history is provided by the patient and medical records.       Past Medical History:  Diagnosis Date  . HIV (human immunodeficiency virus infection) [facility])     Patient Active Problem List   Diagnosis Date Noted  . Testicular pain 04/14/2019  . HPV vaccine counseling 10/22/2017  . Medication monitoring encounter 09/18/2016  . Weight loss 09/18/2016  . Tobacco abuse 06/18/2016  . Human immunodeficiency virus (HIV) disease ([place]) 05/31/2016  . Screening examination for venereal disease 05/31/2016  . Encounter for long-term (current) use of high-risk medication 05/31/2016    History reviewed. No pertinent surgical history.     History reviewed. No pertinent family history.  Social History   Tobacco Use  . Smoking status: Former Smoker    Types: Cigars  . Smokeless tobacco: Never Used  Substance Use Topics  . Alcohol use: Yes   Comment: occasional  . Drug use: Yes    Types: Marijuana    Comment: occasional    Home Medications Prior to Admission medications   Medication Sig Start Date End Date Taking? Authorizing Provider  bictegravir-emtricitabine-tenofovir AF (BIKTARVY) 50-200-25 MG TABS tablet Take 1 tablet by mouth daily. 04/03/19   Comer, Okey Regal, MD  estradiol (ESTRACE) 1 MG tablet Take 0.5 mg by mouth daily.    [provider]  hydrOXYzine (ATARAX/VISTARIL) 25 MG tablet Take 1 tablet (25 mg total) by mouth every 6 (six) hours. 05/07/19   Eustaquio Maize, PA-C  spironolactone (ALDACTONE) 100 MG tablet Take 100 mg by mouth daily.    [provider]    Allergies    Patient has no known allergies.  Review of Systems   Review of Systems  Constitutional: Negative for chills and fever.  HENT: Negative for trouble swallowing.   Respiratory: Negative for shortness of breath.   Skin:       + itching    Physical Exam Updated Vital Signs BP (!) 143/91   Pulse 78   Temp 98.5 F (36.9 C)   Resp 16   Ht 5\' 7"  (1.702 m)   Wt 63.5 kg   SpO2 100%   BMI 21.93 kg/m   Physical Exam Vitals and nursing note reviewed.  Constitutional:      Appearance: He is not ill-appearing.  HENT:     Head: Normocephalic and atraumatic.  Eyes:     Conjunctiva/sclera: Conjunctivae normal.  Cardiovascular:  Rate and Rhythm: Normal rate and regular rhythm.  Pulmonary:     Effort: Pulmonary effort is normal.     Breath sounds: Normal breath sounds.  Skin:    General: Skin is warm and dry.     Coloration: Skin is not jaundiced.     Findings: No rash.     Comments: No obvious rash appreciated. No lesions to webs of fingers.   Neurological:     Mental Status: He is alert.     ED Results / Procedures / Treatments   Labs (all labs ordered are listed, but only abnormal results are displayed) Labs Reviewed - No data to display  EKG None  Radiology No results found.  Procedures Procedures  (including critical care time)  Medications Ordered in ED Medications - No data to display  ED Course  I have reviewed the triage vital signs and the nursing notes.  Pertinent labs & imaging results that were available during my care of the patient were reviewed by me and considered in my medical decision making (see chart for details).    MDM Rules/Calculators/A&P                      24 year old male with a history of HIV well-controlled on Biktarvy who presents to the ED with diffuse itching.  Patient concerned he could have scabies as he saw a video online of an individual who bought from the same company that he did recently with scabies.  He has no obvious lesions to his skin currently no burrowing to the webs of his fingers.  No new hygiene products, detergents, lotions.  Difficult to discern why patient is having itching.  He is not jaundiced today.  I will discharge home with hydroxyzine to see if this helps with itching.  Patient advised to follow-up with PCP. He is in agreement with plan and stable for discharge home.   This note was prepared using Dragon voice recognition software and may include unintentional dictation errors due to the inherent limitations of voice recognition software.  Final Clinical Impression(s) / ED Diagnoses Final diagnoses:  Pruritus    Rx / DC Orders ED Discharge Orders         Ordered    hydrOXYzine (ATARAX/VISTARIL) 25 MG tablet  Every 6 hours     05/07/19 2244           Discharge Instructions     Please pick up medication and take as prescribed  Wash all bedding, clothes, and towels in free and clear detergent to see if this helps as you may be reacting to something in the clothes itself I do not see any signs of scabies today Follow up with your PCP       Tanda Rockers, PA-C 05/07/19 2338    Geoffery Lyons, MD 05/07/19 2345

## 2019-05-07 NOTE — ED Triage Notes (Signed)
Pt c/o rash and " bites" to skin x 3 days

## 2019-05-07 NOTE — Discharge Instructions (Addendum)
Please pick up medication and take as prescribed  Wash all bedding, clothes, and towels in free and clear detergent to see if this helps as you may be reacting to something in the clothes itself I do not see any signs of scabies today Follow up with your PCP

## 2019-08-27 ENCOUNTER — Other Ambulatory Visit: Payer: Self-pay | Admitting: Internal Medicine

## 2019-08-27 DIAGNOSIS — B20 Human immunodeficiency virus [HIV] disease: Secondary | ICD-10-CM

## 2019-09-02 ENCOUNTER — Other Ambulatory Visit: Payer: Self-pay

## 2019-09-02 ENCOUNTER — Encounter: Payer: Self-pay | Admitting: Internal Medicine

## 2019-09-02 ENCOUNTER — Ambulatory Visit: Payer: Self-pay

## 2019-09-02 ENCOUNTER — Ambulatory Visit (INDEPENDENT_AMBULATORY_CARE_PROVIDER_SITE_OTHER): Payer: Self-pay | Admitting: Internal Medicine

## 2019-09-02 VITALS — BP 128/79 | HR 80 | Wt 138.0 lb

## 2019-09-02 DIAGNOSIS — B20 Human immunodeficiency virus [HIV] disease: Secondary | ICD-10-CM

## 2019-09-02 DIAGNOSIS — Z113 Encounter for screening for infections with a predominantly sexual mode of transmission: Secondary | ICD-10-CM

## 2019-09-02 DIAGNOSIS — Z79899 Other long term (current) drug therapy: Secondary | ICD-10-CM

## 2019-09-02 DIAGNOSIS — Z5181 Encounter for therapeutic drug level monitoring: Secondary | ICD-10-CM

## 2019-09-03 LAB — CYTOLOGY, (ORAL, ANAL, URETHRAL) ANCILLARY ONLY
Chlamydia: NEGATIVE
Chlamydia: NEGATIVE
Comment: NEGATIVE
Comment: NEGATIVE
Comment: NORMAL
Comment: NORMAL
Neisseria Gonorrhea: NEGATIVE
Neisseria Gonorrhea: NEGATIVE

## 2019-09-03 LAB — URINE CYTOLOGY ANCILLARY ONLY
Chlamydia: NEGATIVE
Comment: NEGATIVE
Comment: NORMAL
Neisseria Gonorrhea: NEGATIVE

## 2019-09-03 LAB — T-HELPER CELL (CD4) - (RCID CLINIC ONLY)
CD4 % Helper T Cell: 36 % (ref 33–65)
CD4 T Cell Abs: 948 /uL (ref 400–1790)

## 2019-09-03 NOTE — Assessment & Plan Note (Signed)
Will monitor his LFTs, creat

## 2019-09-03 NOTE — Assessment & Plan Note (Signed)
Will check his labs today and otherwise can rtc in 6 months.

## 2019-09-03 NOTE — Assessment & Plan Note (Signed)
Will check his lipid panel 

## 2019-09-03 NOTE — Progress Notes (Signed)
   Subjective:    Patient ID: Danny [name], male    DOB: 1995/08/06, 24 y.o.   MRN: [id number]  HPI He is here for follow up of HIV He continues on Biktarvy and denies any missed doses.  No new issues.  He has not yet had the COVID vaccine due to hesitancy.  No associated n/v/d.     Review of Systems  Constitutional: Negative for fatigue.  Gastrointestinal: Negative for diarrhea and nausea.  Skin: Negative for rash.       Objective:   Physical Exam Constitutional:      Appearance: Normal appearance.  Eyes:     General: No scleral icterus. Pulmonary:     Effort: Pulmonary effort is normal.  Neurological:     General: No focal deficit present.     Mental Status: He is alert.  Psychiatric:        Mood and Affect: Mood normal.   SH: no current tobacco use        Assessment & Plan:

## 2019-09-03 NOTE — Assessment & Plan Note (Signed)
Will screen today 

## 2019-09-07 LAB — LIPID PANEL
Cholesterol: 180 mg/dL (ref <200)
HDL: 71 mg/dL (ref >=40)
LDL Cholesterol (Calc): 89 mg/dL (calc)
Non-HDL Cholesterol (Calc): 109 mg/dL (calc) (ref <130)
Total CHOL/HDL Ratio: 2.5 (calc) (ref <5.0)
Triglycerides: 103 mg/dL (ref <150)

## 2019-09-07 LAB — COMPLETE METABOLIC PANEL WITH GFR
AG Ratio: 1.3 (calc) (ref 1.0–2.5)
ALT: 12 U/L (ref 9–46)
AST: 14 U/L (ref 10–40)
Albumin: 4.8 g/dL (ref 3.6–5.1)
Alkaline phosphatase (APISO): 54 U/L (ref 36–130)
BUN: 18 mg/dL (ref 7–25)
CO2: 27 mmol/L (ref 20–32)
Calcium: 10 mg/dL (ref 8.6–10.3)
Chloride: 100 mmol/L (ref 98–110)
Creat: 0.9 mg/dL (ref 0.60–1.35)
GFR, Est African American: 141 mL/min/{1.73_m2} (ref >=60)
GFR, Est Non African American: 122 mL/min/{1.73_m2} (ref >=60)
Globulin: 3.6 g/dL (calc) (ref 1.9–3.7)
Glucose, Bld: 89 mg/dL (ref 65–99)
Potassium: 4.1 mmol/L (ref 3.5–5.3)
Sodium: 137 mmol/L (ref 135–146)
Total Bilirubin: 0.5 mg/dL (ref 0.2–1.2)
Total Protein: 8.4 g/dL — ABNORMAL HIGH (ref 6.1–8.1)

## 2019-09-07 LAB — CBC WITH DIFFERENTIAL/PLATELET
Absolute Monocytes: 623 cells/uL (ref 200–950)
Basophils Absolute: 16 cells/uL (ref 0–200)
Basophils Relative: 0.2 %
Eosinophils Absolute: 41 cells/uL (ref 15–500)
Eosinophils Relative: 0.5 %
HCT: 45.8 % (ref 38.5–50.0)
Hemoglobin: 15.7 g/dL (ref 13.2–17.1)
Lymphs Abs: 2575 cells/uL (ref 850–3900)
MCH: 32.1 pg (ref 27.0–33.0)
MCHC: 34.3 g/dL (ref 32.0–36.0)
MCV: 93.7 fL (ref 80.0–100.0)
MPV: 11.6 fL (ref 7.5–12.5)
Monocytes Relative: 7.6 %
Neutro Abs: 4945 cells/uL (ref 1500–7800)
Neutrophils Relative %: 60.3 %
Platelets: 214 10*3/uL (ref 140–400)
RBC: 4.89 10*6/uL (ref 4.20–5.80)
RDW: 12.9 % (ref 11.0–15.0)
Total Lymphocyte: 31.4 %
WBC: 8.2 10*3/uL (ref 3.8–10.8)

## 2019-09-07 LAB — HIV-1 RNA QUANT-NO REFLEX-BLD
HIV 1 RNA Quant: <20 Copies/mL
HIV-1 RNA Quant, Log: <1.3 Log cps/mL

## 2019-09-07 LAB — RPR: RPR Ser Ql: NONREACTIVE

## 2019-10-12 ENCOUNTER — Encounter: Payer: Self-pay | Admitting: Internal Medicine

## 2019-10-16 ENCOUNTER — Encounter (HOSPITAL_BASED_OUTPATIENT_CLINIC_OR_DEPARTMENT_OTHER): Payer: Self-pay

## 2019-10-16 ENCOUNTER — Other Ambulatory Visit: Payer: Self-pay

## 2019-10-16 ENCOUNTER — Emergency Department (HOSPITAL_BASED_OUTPATIENT_CLINIC_OR_DEPARTMENT_OTHER)
Admission: EM | Admit: 2019-10-16 | Discharge: 2019-10-16 | Disposition: A | Discharge status: Home / Self Care | Payer: Self-pay | Attending: Emergency Medicine | Admitting: Emergency Medicine

## 2019-10-16 DIAGNOSIS — Z87891 Personal history of nicotine dependence: Secondary | ICD-10-CM | POA: Insufficient documentation

## 2019-10-16 DIAGNOSIS — Z202 Contact with and (suspected) exposure to infections with a predominantly sexual mode of transmission: Secondary | ICD-10-CM | POA: Insufficient documentation

## 2019-10-16 NOTE — ED Triage Notes (Signed)
Pt states that a former partner (from 3 months ago) told him that he was positive for Syphilis about 4 weeks ago. Pt states that he waited to get checked because he was unsure what it was. Reports that he was tested for STD on 8-4 and was negative.

## 2019-10-16 NOTE — ED Provider Notes (Signed)
[facility] Provider Note   CSN: [id number] Arrival date & time: 10/16/19  0865     History Chief Complaint  Patient presents with  . Exposure to STD    Danny [name] is a 24 y.o. male.  Patient would like to be rechecked for STDs.  States that he had an exposure to syphilis possibly but had a negative syphilis test back in early August.  Has not had sexual engagement with this person since that negative test.  Overall unclear timeline.  No concern for recent exposures to any STDs.  The history is provided by the patient.  Exposure to STD This is a new problem. The current episode started more than 1 week ago. The problem occurs rarely. The problem has not changed since onset.Nothing aggravates the symptoms. Nothing relieves the symptoms. He has tried nothing for the symptoms. The treatment provided no relief.       Past Medical History:  Diagnosis Date  . HIV (human immunodeficiency virus infection) [facility])     Patient Active Problem List   Diagnosis Date Noted  . Testicular pain 04/14/2019  . Medication monitoring encounter 09/18/2016  . Weight loss 09/18/2016  . Tobacco abuse 06/18/2016  . Human immunodeficiency virus (HIV) disease (HCC) 05/31/2016  . Screening examination for venereal disease 05/31/2016  . Encounter for long-term (current) use of high-risk medication 05/31/2016    History reviewed. No pertinent surgical history.     No family history on file.  Social History   Tobacco Use  . Smoking status: Former Smoker    Types: Cigars  . Smokeless tobacco: Never Used  Substance Use Topics  . Alcohol use: Yes    Comment: occasional  . Drug use: Yes    Types: Marijuana    Comment: occasional    Home Medications Prior to Admission medications   Medication Sig Start Date End Date Taking? Authorizing Provider  BIKTARVY 50-200-25 MG TABS tablet TAKE 1 TABLET BY MOUTH DAILY 08/27/19   Comer, Belia Heman, MD  estradiol  (ESTRACE) 1 MG tablet Take 0.5 mg by mouth daily.    [provider]  hydrOXYzine (ATARAX/VISTARIL) 25 MG tablet Take 1 tablet (25 mg total) by mouth every 6 (six) hours. 05/07/19   Tanda Rockers, PA-C  spironolactone (ALDACTONE) 100 MG tablet Take 100 mg by mouth daily.    [provider]    Allergies    Patient has no known allergies.  Review of Systems   Review of Systems  Constitutional: Negative for fever.  Genitourinary: Negative for decreased urine volume, difficulty urinating, discharge, dysuria, enuresis, flank pain, frequency, genital sores, hematuria, penile pain, penile swelling, scrotal swelling, testicular pain and urgency.  Skin: Negative for rash.    Physical Exam Updated Vital Signs BP 129/81 (BP Location: Right Arm)   Pulse 93   Temp 98.5 F (36.9 C) (Oral)   Resp 18   Ht 5\' 8"  (1.727 m)   Wt 63.5 kg   SpO2 100%   BMI 21.29 kg/m   Physical Exam Vitals reviewed.  Genitourinary:    Testes: Normal.     Comments: Overall GU exam is unremarkable, 1 area of raised lesion likely papilloma/ingrown hair, no ulcers Neurological:     Mental Status: He is alert.     ED Results / Procedures / Treatments   Labs (all labs ordered are listed, but only abnormal results are displayed) Labs Reviewed  RPR  GC/CHLAMYDIA PROBE AMP ([place]) NOT AT [facility]  EKG None  Radiology No results found.  Procedures Procedures (including critical care time)  Medications Ordered in ED Medications - No data to display  ED Course  I have reviewed the triage vital signs and the nursing notes.  Pertinent labs & imaging results that were available during my care of the patient were reviewed by me and considered in my medical decision making (see chart for details).    MDM Rules/Calculators/A&P                          Danny [name] is a 24 year old male with history of HIV presents to the ED for STD check.  No obvious recent exposures.  Had a  negative syphilis test early August.  States that he has a partner who tested positive for syphilis 2 months ago but had a negative test since that possible exposure, still concerned and wants to be checked again.  Overall timeline is convoluted.  Does not have any ulcers on GU exam.  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  Will get STD testing.  Follows with infectious disease.  Hold off any empiric treatment at this time.  This chart was dictated using voice recognition software.  Despite best efforts to proofread,  errors can occur which can change the documentation meaning.     Final Clinical Impression(s) / ED Diagnoses Final diagnoses:  Possible exposure to STD    Rx / DC Orders ED Discharge Orders    None       Virgina Norfolk, DO 10/16/19 1017

## 2019-10-17 LAB — RPR: RPR Ser Ql: NONREACTIVE

## 2019-10-19 LAB — GC/CHLAMYDIA PROBE AMP (~~LOC~~) NOT AT ARMC
Chlamydia: NEGATIVE
Comment: NEGATIVE
Comment: NORMAL
Neisseria Gonorrhea: NEGATIVE

## 2020-02-02 ENCOUNTER — Other Ambulatory Visit: Payer: Self-pay

## 2020-02-02 ENCOUNTER — Encounter (HOSPITAL_BASED_OUTPATIENT_CLINIC_OR_DEPARTMENT_OTHER): Payer: Self-pay | Admitting: Emergency Medicine

## 2020-02-02 ENCOUNTER — Emergency Department (HOSPITAL_BASED_OUTPATIENT_CLINIC_OR_DEPARTMENT_OTHER): Payer: Self-pay

## 2020-02-02 ENCOUNTER — Emergency Department (HOSPITAL_BASED_OUTPATIENT_CLINIC_OR_DEPARTMENT_OTHER)
Admission: EM | Admit: 2020-02-02 | Discharge: 2020-02-02 | Disposition: A | Discharge status: Home / Self Care | Payer: Self-pay | Attending: Emergency Medicine | Admitting: Emergency Medicine

## 2020-02-02 DIAGNOSIS — R053 Chronic cough: Secondary | ICD-10-CM | POA: Insufficient documentation

## 2020-02-02 DIAGNOSIS — Z87891 Personal history of nicotine dependence: Secondary | ICD-10-CM | POA: Insufficient documentation

## 2020-02-02 DIAGNOSIS — R002 Palpitations: Secondary | ICD-10-CM | POA: Insufficient documentation

## 2020-02-02 DIAGNOSIS — R109 Unspecified abdominal pain: Secondary | ICD-10-CM | POA: Insufficient documentation

## 2020-02-02 DIAGNOSIS — R0609 Other forms of dyspnea: Secondary | ICD-10-CM | POA: Insufficient documentation

## 2020-02-02 DIAGNOSIS — Z20822 Contact with and (suspected) exposure to covid-19: Secondary | ICD-10-CM | POA: Insufficient documentation

## 2020-02-02 DIAGNOSIS — R630 Anorexia: Secondary | ICD-10-CM | POA: Insufficient documentation

## 2020-02-02 DIAGNOSIS — R0602 Shortness of breath: Secondary | ICD-10-CM | POA: Insufficient documentation

## 2020-02-02 DIAGNOSIS — B20 Human immunodeficiency virus [HIV] disease: Secondary | ICD-10-CM | POA: Insufficient documentation

## 2020-02-02 LAB — BASIC METABOLIC PANEL
Anion gap: 10 (ref 5–15)
BUN: 17 mg/dL (ref 6–20)
CO2: 23 mmol/L (ref 22–32)
Calcium: 9.2 mg/dL (ref 8.9–10.3)
Chloride: 102 mmol/L (ref 98–111)
Creatinine, Ser: 0.87 mg/dL (ref 0.61–1.24)
GFR, Estimated: >60 mL/min (ref >60)
Glucose, Bld: 95 mg/dL (ref 70–99)
Potassium: 3.5 mmol/L (ref 3.5–5.1)
Sodium: 135 mmol/L (ref 135–145)

## 2020-02-02 LAB — CBC
HCT: 43.4 % (ref 39.0–52.0)
Hemoglobin: 15.6 g/dL (ref 13.0–17.0)
MCH: 32.9 pg (ref 26.0–34.0)
MCHC: 35.9 g/dL (ref 30.0–36.0)
MCV: 91.6 fL (ref 80.0–100.0)
Platelets: 209 10*3/uL (ref 150–400)
RBC: 4.74 MIL/uL (ref 4.22–5.81)
RDW: 12.8 % (ref 11.5–15.5)
WBC: 7.8 10*3/uL (ref 4.0–10.5)
nRBC: 0 % (ref 0.0–0.2)

## 2020-02-02 LAB — D-DIMER, QUANTITATIVE: D-Dimer, Quant: <0.27 ug/mL-FEU (ref 0.00–0.50)

## 2020-02-02 LAB — GROUP A STREP BY PCR: Group A Strep by PCR: NOT DETECTED

## 2020-02-02 NOTE — ED Notes (Signed)
States throat is dry, sore at times, but denies having pain.

## 2020-02-02 NOTE — ED Provider Notes (Signed)
[facility] EMERGENCY DEPARTMENT Provider Note   CSN: [id number] Arrival date & time: 02/02/20  1511     History Chief Complaint  Patient presents with   Cough    Danny [name] is a 25 y.o. male with HIV on Biktarvy who presents to the ED with a 1 month history of decreased appetite and cough productive of red-tinged mucus.  On my examination, patient reports that for the past month he has been having dyspnea on exertion, palpitations, and pinkish tinged mucus with cough.  He states that he has been coughing for several months and attributes it to smoking marijuana on a regular basis.  He does not smoke any tobacco.  He also endorses intermittent abdominal discomfort over the course of the past few months.  He denies any history of clots or clotting disorder, but does mention that he is on hormone replacement therapy, specifically estradiol.  He denies any chest pain, inability to breathe, nausea or emesis, unilateral extremity swelling or edema, or other symptoms.  Patient states that they are concerned about pulmonary embolism.  Patient is not vaccinated for COVID-19.  HPI     Past Medical History:  Diagnosis Date   HIV (human immunodeficiency virus infection) (HCC)     Patient Active Problem List   Diagnosis Date Noted   Testicular pain 04/14/2019   Medication monitoring encounter 09/18/2016   Weight loss 09/18/2016   Tobacco abuse 06/18/2016   Human immunodeficiency virus (HIV) disease (HCC) 05/31/2016   Screening examination for venereal disease 05/31/2016   Encounter for long-term (current) use of high-risk medication 05/31/2016    History reviewed. No pertinent surgical history.     History reviewed. No pertinent family history.  Social History   Tobacco Use   Smoking status: Former Smoker    Types: Cigars   Smokeless tobacco: Never Used  Substance Use Topics   Alcohol use: Yes    Comment: occasional   Drug use: Yes    Types:  Marijuana    Comment: occasional    Home Medications Prior to Admission medications   Medication Sig Start Date End Date Taking? Authorizing Provider  BIKTARVY 50-200-25 MG TABS tablet TAKE 1 TABLET BY MOUTH DAILY 08/27/19   Comer, Belia Heman, MD  estradiol (ESTRACE) 1 MG tablet Take 0.5 mg by mouth daily.    [provider]  hydrOXYzine (ATARAX/VISTARIL) 25 MG tablet Take 1 tablet (25 mg total) by mouth every 6 (six) hours. 05/07/19   Tanda Rockers, PA-C  spironolactone (ALDACTONE) 100 MG tablet Take 100 mg by mouth daily.    [provider]    Allergies    Patient has no known allergies.  Review of Systems   Review of Systems  All other systems reviewed and are negative.   Physical Exam Updated Vital Signs BP 128/85 (BP Location: Right Arm)    Pulse 68    Temp 99.1 F (37.3 C) (Oral)    Resp 18    Ht 5\' 8"  (1.727 m)    Wt 63.5 kg    SpO2 100%    BMI 21.29 kg/m   Physical Exam Vitals and nursing note reviewed. Exam conducted with a chaperone present.  Constitutional:      Appearance: Normal appearance.  HENT:     Head: Normocephalic and atraumatic.     Mouth/Throat:     Mouth: Mucous membranes are moist.     Pharynx: Oropharynx is clear. No oropharyngeal exudate or posterior oropharyngeal erythema.  Comments: No trismus.  Tolerating secretions well.  Normal exam. Eyes:     General: No scleral icterus.    Conjunctiva/sclera: Conjunctivae normal.  Cardiovascular:     Rate and Rhythm: Normal rate and regular rhythm.     Pulses: Normal pulses.  Pulmonary:     Effort: Pulmonary effort is normal. No respiratory distress.     Breath sounds: Normal breath sounds. No wheezing or rales.     Comments: CTA bilaterally.  No increased work of breathing. Abdominal:     General: Abdomen is flat. There is no distension.     Palpations: Abdomen is soft.     Tenderness: There is no abdominal tenderness. There is no guarding.  Skin:    General: Skin is dry.   Neurological:     Mental Status: He is alert.     GCS: GCS eye subscore is 4. GCS verbal subscore is 5. GCS motor subscore is 6.  Psychiatric:        Mood and Affect: Mood normal.        Behavior: Behavior normal.        Thought Content: Thought content normal.     ED Results / Procedures / Treatments   Labs (all labs ordered are listed, but only abnormal results are displayed) Labs Reviewed  GROUP A STREP BY PCR  SARS CORONAVIRUS 2 (TAT 6-24 HRS)  D-DIMER, QUANTITATIVE (NOT AT [facility])  CBC  BASIC METABOLIC PANEL    EKG None  Radiology DG Chest Portable 1 View  Result Date: 02/02/2020 CLINICAL DATA:  Cough. EXAM: PORTABLE CHEST 1 VIEW COMPARISON:  None. FINDINGS: The heart size and mediastinal contours are within normal limits. Both lungs are clear. The visualized skeletal structures are unremarkable. Incidentally noted is a right-sided cervical rib. IMPRESSION: No active disease. Electronically Signed   By: Constance Holster M.D.   On: 02/02/2020 20:35    Procedures Procedures (including critical care time)  Medications Ordered in ED Medications - No data to display  ED Course  I have reviewed the triage vital signs and the nursing notes.  Pertinent labs & imaging results that were available during my care of the patient were reviewed by me and considered in my medical decision making (see chart for details).    MDM Rules/Calculators/A&P                          Patient is concerned about pulmonary embolism.  I informed him that I have particular low concern, but given his pinkish sputum in conjunction with hormone replacement therapy, will obtain one-time D-dimer.  Given his shortness of breath and palpitations, also obtain basic laboratory work-up including CBC and BMP.  Will obtain chest x-ray given his cough x3 months.  Rapid strep by PCR and COVID-19 testing has been obtained in triage.  Plain films of chest are personally reviewed and demonstrate no acute  cardiopulmonary disease.  CBC without anemia or leukocytosis concerning for infection.  BMP is also without any electrolyte derangement that may be contributing to his intermittent palpitations.  Renal function is preserved.  D-dimer is obtained given patient's concern for pulmonary embolism and his pink-tinged sputum in conjunction with hormonal therapies.  This is very reassuring.  I discussed the findings with patient and do not feel as though CT is warranted.  Patient agrees.  Patient denies any history of heartburn, but given chronic cough, must consider GERD as cause.  They are reassured by today's work-up.  Will follow up with their primary care provider/infectious disease specialist for ongoing evaluation and management.   Final Clinical Impression(s) / ED Diagnoses Final diagnoses:  Chronic cough    Rx / DC Orders ED Discharge Orders    None       Lorelee New, PA-C 02/02/20 2106    Milagros Loll, MD 02/09/20 [phone]

## 2020-02-02 NOTE — ED Notes (Signed)
Upon visual inspection, throat is noted to be red, Rapid Strep Swab and Covid Swab obtained

## 2020-02-02 NOTE — Discharge Instructions (Signed)
Please follow-up with your primary care provider/infectious disease doctor regarding today for ongoing evaluation and management.  Your laboratory work-up in imaging of the chest was entirely unremarkable and reassuring.  As was your physical exam.  I encourage you to discontinue smoking, at least until symptoms improve.  Please also consider antacids as your chronic cough may be related to GERD.  Return to the ED or seek immediate medical attention should you experience any new or worsening symptoms.

## 2020-02-02 NOTE — ED Notes (Signed)
Having a lot of coughing being productive, states has pinkish colored sputum, has been having increased SOB as well. Denies any fevers. States that increase in SOB can occur at any time, even when lying down.

## 2020-02-02 NOTE — ED Triage Notes (Signed)
Pt reports cough with red tinged mucus and decreased appetite x 1 month. Pt denies fever, reports tachycardia when lying down. No s/s of distress.Pt denies fever, denies diaphoresis

## 2020-02-03 ENCOUNTER — Other Ambulatory Visit: Payer: Self-pay | Admitting: Internal Medicine

## 2020-02-03 DIAGNOSIS — B20 Human immunodeficiency virus [HIV] disease: Secondary | ICD-10-CM

## 2020-02-03 LAB — SARS CORONAVIRUS 2 (TAT 6-24 HRS): SARS Coronavirus 2: NEGATIVE

## 2020-02-05 ENCOUNTER — Other Ambulatory Visit: Payer: Self-pay

## 2020-02-05 ENCOUNTER — Ambulatory Visit: Payer: Self-pay

## 2020-02-08 ENCOUNTER — Encounter: Payer: Self-pay | Admitting: Internal Medicine

## 2020-02-23 ENCOUNTER — Other Ambulatory Visit: Payer: Self-pay

## 2020-02-23 DIAGNOSIS — B20 Human immunodeficiency virus [HIV] disease: Secondary | ICD-10-CM

## 2020-02-23 DIAGNOSIS — Z113 Encounter for screening for infections with a predominantly sexual mode of transmission: Secondary | ICD-10-CM

## 2020-02-23 DIAGNOSIS — Z79899 Other long term (current) drug therapy: Secondary | ICD-10-CM

## 2020-02-24 ENCOUNTER — Other Ambulatory Visit: Payer: Self-pay

## 2020-02-24 DIAGNOSIS — Z79899 Other long term (current) drug therapy: Secondary | ICD-10-CM

## 2020-02-24 DIAGNOSIS — B20 Human immunodeficiency virus [HIV] disease: Secondary | ICD-10-CM

## 2020-02-24 DIAGNOSIS — Z113 Encounter for screening for infections with a predominantly sexual mode of transmission: Secondary | ICD-10-CM

## 2020-02-25 LAB — T-HELPER CELL (CD4) - (RCID CLINIC ONLY)
CD4 % Helper T Cell: 34 % (ref 33–65)
CD4 T Cell Abs: 747 /uL (ref 400–1790)

## 2020-02-26 LAB — COMPLETE METABOLIC PANEL WITH GFR
AG Ratio: 1.5 (calc) (ref 1.0–2.5)
ALT: 15 U/L (ref 9–46)
AST: 16 U/L (ref 10–40)
Albumin: 4.5 g/dL (ref 3.6–5.1)
Alkaline phosphatase (APISO): 50 U/L (ref 36–130)
BUN: 16 mg/dL (ref 7–25)
CO2: 28 mmol/L (ref 20–32)
Calcium: 9.6 mg/dL (ref 8.6–10.3)
Chloride: 103 mmol/L (ref 98–110)
Creat: 0.74 mg/dL (ref 0.60–1.35)
GFR, Est African American: 152 mL/min/{1.73_m2} (ref >=60)
GFR, Est Non African American: 131 mL/min/{1.73_m2} (ref >=60)
Globulin: 3.1 g/dL (calc) (ref 1.9–3.7)
Glucose, Bld: 85 mg/dL (ref 65–99)
Potassium: 4.3 mmol/L (ref 3.5–5.3)
Sodium: 137 mmol/L (ref 135–146)
Total Bilirubin: 0.4 mg/dL (ref 0.2–1.2)
Total Protein: 7.6 g/dL (ref 6.1–8.1)

## 2020-02-26 LAB — RPR: RPR Ser Ql: NONREACTIVE

## 2020-02-26 LAB — HIV-1 RNA QUANT-NO REFLEX-BLD
HIV 1 RNA Quant: <20 Copies/mL
HIV-1 RNA Quant, Log: <1.3 Log cps/mL

## 2020-02-26 LAB — CBC WITH DIFFERENTIAL/PLATELET
Absolute Monocytes: 569 cells/uL (ref 200–950)
Basophils Absolute: 29 cells/uL (ref 0–200)
Basophils Relative: 0.4 %
Eosinophils Absolute: 51 cells/uL (ref 15–500)
Eosinophils Relative: 0.7 %
HCT: 44.3 % (ref 38.5–50.0)
Hemoglobin: 15.4 g/dL (ref 13.2–17.1)
Lymphs Abs: 2278 cells/uL (ref 850–3900)
MCH: 32.7 pg (ref 27.0–33.0)
MCHC: 34.8 g/dL (ref 32.0–36.0)
MCV: 94.1 fL (ref 80.0–100.0)
MPV: 11.6 fL (ref 7.5–12.5)
Monocytes Relative: 7.8 %
Neutro Abs: 4373 cells/uL (ref 1500–7800)
Neutrophils Relative %: 59.9 %
Platelets: 198 10*3/uL (ref 140–400)
RBC: 4.71 10*6/uL (ref 4.20–5.80)
RDW: 12.7 % (ref 11.0–15.0)
Total Lymphocyte: 31.2 %
WBC: 7.3 10*3/uL (ref 3.8–10.8)

## 2020-02-26 LAB — URINE CYTOLOGY ANCILLARY ONLY
Chlamydia: NEGATIVE
Comment: NEGATIVE
Comment: NORMAL
Neisseria Gonorrhea: NEGATIVE

## 2020-02-26 LAB — LIPID PANEL
Cholesterol: 164 mg/dL (ref <200)
HDL: 65 mg/dL (ref >=40)
LDL Cholesterol (Calc): 82 mg/dL (calc)
Non-HDL Cholesterol (Calc): 99 mg/dL (calc) (ref <130)
Total CHOL/HDL Ratio: 2.5 (calc) (ref <5.0)
Triglycerides: 83 mg/dL (ref <150)

## 2020-03-02 ENCOUNTER — Telehealth: Payer: Self-pay

## 2020-03-02 NOTE — Telephone Encounter (Signed)
Patient called stating his partner was diagnosed with syphilis today.  Patient's last RPR on 02/24/20 was non reactive. Patient would like to know if he should still be treated or be retested. Patient would like to discuss this at his next appointment with Dr. Luciana Axe on 03/09/20. Ellana Kawa T Pricilla Loveless

## 2020-03-07 ENCOUNTER — Telehealth: Payer: Self-pay

## 2020-03-07 NOTE — Telephone Encounter (Signed)
Patient called to state that his partner was just treated for Syphilis today. Patient says that his partner's doctor says "there's no way he doesn't have it too." Patient requesting treatment.   RN informed patient that treatment will be discussed at his upcoming appointment with Dr. Earlene Plater on 2/9. Patient verbalized understanding and has no further questions.   Sandie Ano, RN

## 2020-03-07 NOTE — Telephone Encounter (Signed)
Patient called needing to reschedule his 6 month follow-up with Dr. Luciana Axe as he will be unable to make an afternoon appointment. He is requesting a morning appointment, RN informed him Dr. Luciana Axe is not here that morning and offered him Friday 2/11 appointment.   Patient requesting to be seen as soon as possible as his partner's RPR just came back positive. Patient was last tested on 02/24/20 and was non-reactive. He states his partner's test came back positive on 02/26/20.   RN scheduled patient with Dr. Earlene Plater on 03/09/20 at 8:45 am for repeat testing and follow-up.   Sandie Ano, RN

## 2020-03-09 ENCOUNTER — Encounter: Payer: Self-pay | Admitting: Internal Medicine

## 2020-03-09 ENCOUNTER — Other Ambulatory Visit: Payer: Self-pay

## 2020-03-09 ENCOUNTER — Ambulatory Visit (INDEPENDENT_AMBULATORY_CARE_PROVIDER_SITE_OTHER): Payer: Self-pay | Admitting: Internal Medicine

## 2020-03-09 VITALS — BP 130/88 | HR 76 | Temp 98.0°F | Ht 66.0 in | Wt 143.0 lb

## 2020-03-09 DIAGNOSIS — Z113 Encounter for screening for infections with a predominantly sexual mode of transmission: Secondary | ICD-10-CM

## 2020-03-09 DIAGNOSIS — Z79899 Other long term (current) drug therapy: Secondary | ICD-10-CM

## 2020-03-09 DIAGNOSIS — Z202 Contact with and (suspected) exposure to infections with a predominantly sexual mode of transmission: Secondary | ICD-10-CM

## 2020-03-09 DIAGNOSIS — B20 Human immunodeficiency virus [HIV] disease: Secondary | ICD-10-CM

## 2020-03-09 MED ORDER — PENICILLIN G BENZATHINE 1200000 UNIT/2ML IM SUSP
1.2000 10*6.[IU] | Freq: Once | INTRAMUSCULAR | Status: AC
  Administered 2020-03-09: 1.2 10*6.[IU] via INTRAMUSCULAR

## 2020-03-09 NOTE — Assessment & Plan Note (Signed)
Doing well on biktarvy with undetectable viral load, healthy CD4 count, and no missed doses.  Will continue and have him follow up with Dr Luciana Axe in 6 months.

## 2020-03-09 NOTE — Assessment & Plan Note (Signed)
Labs are stable on Biktarvy.  Will continue and follow up in 6 months.

## 2020-03-09 NOTE — Addendum Note (Signed)
Addended by: Valarie Cones on: 03/09/2020 09:14 AM   Modules accepted: Orders

## 2020-03-09 NOTE — Patient Instructions (Signed)
Thank you for coming to see me today. It was a pleasure seeing you.  To Do: Marland Kitchen We gave you a dose of Bicillin today . I will update you on labs and swabs once I have the results . Please follow up with Dr Luciana Axe in 6 months  If you have any questions or concerns, please do not hesitate to call the office at [phone].  Take Care,   Gwynn Burly, DO

## 2020-03-09 NOTE — Progress Notes (Signed)
[facility]   CHIEF COMPLAINT    HIV follow up.   Partner tested positive for syphilis.   SUBJECTIVE:    Danny [name] is a 25 y.o. male with PMHx as below who presents to the clinic for HIV follow up.   He typically follows up with Dr Luciana Axe and was last seen by him in August 2021 and was continued on his Biktarvy at that time.   Recent labs done  02/24/20 show healthy CD4 count and VL <20.  RPR was NR and GC/CT from urine negative.    Patient now with his partner testing positive for syphilis on 1/28 and inquiring about next steps.  Denies rash or genital lesion.  No systemic symptoms.  Does not know what his partners RPR was.  They have been sexually active.  His partner did not test positive for any other STI.   Please see A&P for the details of today's visit and status of the patient's medical problems.   Patient's Medications  New Prescriptions   No medications on file  Previous Medications   BIKTARVY 50-200-25 MG TABS TABLET    TAKE 1 TABLET BY MOUTH DAILY   ESTRADIOL (ESTRACE) 1 MG TABLET    Take 0.5 mg by mouth daily.   HYDROXYZINE (ATARAX/VISTARIL) 25 MG TABLET    Take 1 tablet (25 mg total) by mouth every 6 (six) hours.   SPIRONOLACTONE (ALDACTONE) 100 MG TABLET    Take 100 mg by mouth daily.  Modified Medications   No medications on file  Discontinued Medications   No medications on file      Past Medical History:  Diagnosis Date  . HIV (human immunodeficiency virus infection) (HCC)     Social History   Tobacco Use  . Smoking status: Former Smoker    Types: Cigars  . Smokeless tobacco: Never Used  Substance Use Topics  . Alcohol use: Yes    Comment: occasional  . Drug use: Yes    Types: Marijuana    Comment: occasional    No family history on file.  No Known Allergies  Review of Systems  Constitutional: Negative.   Respiratory: Negative.   Cardiovascular: Negative.   Genitourinary: Negative.   Skin:  Negative for rash.     OBJECTIVE:    Vitals:   03/09/20 0845  BP: 130/88  Pulse: 76  Temp: 98 F (36.7 C)  Weight: 143 lb (64.9 kg)  Height: 5\' 6"  (1.676 m)     Body mass index is 23.08 kg/m.  Physical Exam Constitutional:      Appearance: Normal appearance.  HENT:     Head: Normocephalic and atraumatic.  Skin:    Findings: No rash.  Neurological:     General: No focal deficit present.     Mental Status: He is alert and oriented to person, place, and time.  Psychiatric:        Mood and Affect: Mood normal.        Behavior: Behavior normal.     Labs and Microbiology: CMP Latest Ref Rng & Units 02/24/2020 02/02/2020 09/02/2019  Glucose 65 - 99 mg/dL 85 95 89  BUN 7 - 25 mg/dL 16 17 18   Creatinine 0.60 - 1.35 mg/dL 11/02/2019 4.09  Sodium 135 - 146 mmol/L 137 135 137  Potassium 3.5 - 5.3 mmol/L 4.3 3.5 4.1  Chloride 98 - 110 mmol/L 103 102 100  CO2 20 - 32 mmol/L 28 23  27  Calcium 8.6 - 10.3 mg/dL 9.6 9.2 05.3  Total Protein 6.1 - 8.1 g/dL 7.6 - 8.4(H)  Total Bilirubin 0.2 - 1.2 mg/dL 0.4 - 0.5  Alkaline Phos 48 - 230 U/L - - -  AST 10 - 40 U/L 16 - 14  ALT 9 - 46 U/L 15 - 12   CBC Latest Ref Rng & Units 02/24/2020 02/02/2020 09/02/2019  WBC 3.8 - 10.8 Thousand/uL 7.3 7.8 8.2  Hemoglobin 13.2 - 17.1 g/dL 97.6 73.4 19.3  Hematocrit 38.5 - 50.0 % 44.3 43.4 45.8  Platelets 140 - 400 Thousand/uL 198 209 214     Lab Results  Component Value Date   HIV1RNAQUANT <20 02/24/2020   HIV1RNAQUANT <20 09/02/2019   HIV1RNAQUANT <20 NOT DETECTED 11/12/2018   CD4TABS 747 02/24/2020   CD4TABS 948 09/02/2019   CD4TABS 605 11/12/2018    RPR and STI: Lab Results  Component Value Date   LABRPR NON-REACTIVE 02/24/2020   LABRPR NON REACTIVE 10/16/2019   LABRPR NON-REACTIVE 09/02/2019   LABRPR NON-REACTIVE 04/09/2018   LABRPR NON-REACTIVE 03/21/2018    STI Results GC CT  02/24/2020 Negative Negative  10/16/2019 Negative Negative  09/02/2019 Negative Negative  09/02/2019 Negative  Negative  09/02/2019 Negative Negative  03/04/2019 Positive(A) Negative  03/04/2019 Negative Positive(A)  03/21/2018 Negative **POSITIVE**(A)  03/21/2018 Negative **POSITIVE**(A)  03/21/2018 Negative **POSITIVE**(A)  04/18/2017 Negative Negative  04/18/2017 Negative Negative  04/18/2017 Negative Negative  04/04/2017 Negative Negative  04/04/2017 Negative **POSITIVE**(A)    Hepatitis B: Lab Results  Component Value Date   HEPBSAB POS (A) 05/31/2016   HEPBSAG NEGATIVE 05/31/2016   HEPBCAB NON REACTIVE 05/31/2016   Hepatitis C: No results found for: HEPCAB, HCVRNAPCRQN Hepatitis A: Lab Results  Component Value Date   HAV REACTIVE (A) 05/31/2016   Lipids: Lab Results  Component Value Date   CHOL 164 02/24/2020   TRIG 83 02/24/2020   HDL 65 02/24/2020   CHOLHDL 2.5 02/24/2020   VLDL 43 (H) 05/31/2016   LDLCALC 82 02/24/2020      ASSESSMENT & PLAN:    Human immunodeficiency virus (HIV) disease (HCC) Doing well on biktarvy with undetectable viral load, healthy CD4 count, and no missed doses.  Will continue and have him follow up with Dr Luciana Axe in 6 months.   Screening examination for venereal disease Patients partner just tested positive for syphilis and they have been sexually active together.  Patients RPR negative 02/24/20 and historically have been negative.  Does not recall any primary chancre or rash.  Reports his partner also had no symptoms but did receive 3 doses of Bicillin through the health department.  Will go ahead and give him Bicillin x 1 today, repeat RPR, and get GC/CT swabs from urine, rectum, and pharynx  Encounter for long-term (current) use of high-risk medication Labs are stable on Biktarvy.  Will continue and follow up in 6 months.     Vedia Coffer for Infectious Disease [facility] 03/09/2020, 9:02 AM

## 2020-03-09 NOTE — Assessment & Plan Note (Signed)
Patients partner just tested positive for syphilis and they have been sexually active together.  Patients RPR negative 02/24/20 and historically have been negative.  Does not recall any primary chancre or rash.  Reports his partner also had no symptoms but did receive 3 doses of Bicillin through the health department.  Will go ahead and give him Bicillin x 1 today, repeat RPR, and get GC/CT swabs from urine, rectum, and pharynx

## 2020-03-10 ENCOUNTER — Telehealth: Payer: Self-pay

## 2020-03-10 LAB — CYTOLOGY, (ORAL, ANAL, URETHRAL) ANCILLARY ONLY
Chlamydia: NEGATIVE
Chlamydia: NEGATIVE
Comment: NEGATIVE
Comment: NORMAL
Comment: NEGATIVE
Comment: NORMAL
Neisseria Gonorrhea: NEGATIVE
Neisseria Gonorrhea: NEGATIVE

## 2020-03-10 LAB — URINE CYTOLOGY ANCILLARY ONLY
Chlamydia: NEGATIVE
Comment: NEGATIVE
Comment: NORMAL
Neisseria Gonorrhea: NEGATIVE

## 2020-03-10 LAB — RPR: RPR Ser Ql: NONREACTIVE

## 2020-03-10 NOTE — Telephone Encounter (Signed)
Patient returning call. States he was in clinic yesterday and received IM bicillin injections in both buttocks. He states the pain is a 7/10 and it hurts to sit down. Reports that the site was bleeding after administration, but bleeding has since stopped. Patient went home after injection and sat down, did not exercise the muscle. He denies any rash, redness, swelling, or lumps under the skin at the injection site. States his only symptom is pain. RN advised patient to do some squats and apply temperature therapy for pain relief. Explained to patient that Bicillin is a thick medication that is injected deep into the muscle and that it might be sore for the next day or two. Patient verbalized understanding and has no further questions.   Sandie Ano, RN

## 2020-03-10 NOTE — Telephone Encounter (Signed)
Clinic received missed call from patient with no information left in voicemail. RN called patient back, no answer. Left HIPAA compliant voicemail requesting callback.   Sandie Ano, RN

## 2020-03-11 ENCOUNTER — Telehealth: Payer: Self-pay

## 2020-03-11 NOTE — Telephone Encounter (Signed)
Test results relayed to patient. No further questions or concerns.   Nicholi Ghuman Loyola Mast, RN

## 2020-03-11 NOTE — Telephone Encounter (Signed)
-----   Message from Kathlynn Grate, DO sent at 03/11/2020 10:02 AM EST ----- Please let patient know that full  STI panel was negative including RPR so it does not appear that he was recently infected by their partner.  Nonetheless, treatment was given and should continue follow up with Dr Luciana Axe as scheduled.   Thanks, Greig Castilla

## 2020-03-14 ENCOUNTER — Encounter (HOSPITAL_BASED_OUTPATIENT_CLINIC_OR_DEPARTMENT_OTHER): Payer: Self-pay

## 2020-03-14 ENCOUNTER — Telehealth: Payer: Self-pay

## 2020-03-14 ENCOUNTER — Emergency Department (HOSPITAL_BASED_OUTPATIENT_CLINIC_OR_DEPARTMENT_OTHER)
Admission: EM | Admit: 2020-03-14 | Discharge: 2020-03-14 | Disposition: A | Discharge status: Home / Self Care | Payer: Self-pay | Attending: Emergency Medicine | Admitting: Emergency Medicine

## 2020-03-14 ENCOUNTER — Emergency Department (HOSPITAL_BASED_OUTPATIENT_CLINIC_OR_DEPARTMENT_OTHER): Payer: Self-pay

## 2020-03-14 ENCOUNTER — Other Ambulatory Visit: Payer: Self-pay

## 2020-03-14 DIAGNOSIS — M79661 Pain in right lower leg: Secondary | ICD-10-CM | POA: Insufficient documentation

## 2020-03-14 DIAGNOSIS — M79662 Pain in left lower leg: Secondary | ICD-10-CM | POA: Insufficient documentation

## 2020-03-14 DIAGNOSIS — Z79899 Other long term (current) drug therapy: Secondary | ICD-10-CM | POA: Insufficient documentation

## 2020-03-14 DIAGNOSIS — F17290 Nicotine dependence, other tobacco product, uncomplicated: Secondary | ICD-10-CM | POA: Insufficient documentation

## 2020-03-14 DIAGNOSIS — Z21 Asymptomatic human immunodeficiency virus [HIV] infection status: Secondary | ICD-10-CM | POA: Insufficient documentation

## 2020-03-14 DIAGNOSIS — M79604 Pain in right leg: Secondary | ICD-10-CM

## 2020-03-14 NOTE — ED Triage Notes (Signed)
Pt c/o pain to right calf x 3 days-denies injury-NAD-steady gait

## 2020-03-14 NOTE — Discharge Instructions (Signed)
I recommend a combination of tylenol and ibuprofen for management of your pain. You can take a low dose of both at the same time. I recommend 325 mg of Tylenol combined with 400 mg of ibuprofen. This is one regular Tylenol and two regular ibuprofen. You can take these 2-3 times for day for your pain. Please try to take these medications with a small amount of food as well to prevent upsetting your stomach.  Also, please consider topical pain relieving creams such as Voltaran Gel, BioFreeze, or Icy Hot. There is also a pain relieving cream made by [employer]. You should be able to find all of these at your local pharmacy.   Please follow-up with your regular doctor.  You can always return to the ER for reevaluation if your symptoms worsen.  It was a pleasure to meet you.

## 2020-03-14 NOTE — Telephone Encounter (Signed)
Received call from Mathis Fare at [facility] wanting to confirm labs and treatment. RN relayed that patient's RPR was non-reactive on 02/24/20 and on 03/09/20 and that patient was treated with 2.4 million units Bicillin once on 03/09/20 for exposure.   Sandie Ano, RN

## 2020-03-14 NOTE — ED Notes (Signed)
Patient transported to Ultrasound 

## 2020-03-14 NOTE — ED Provider Notes (Signed)
[facility] EMERGENCY DEPARTMENT Provider Note   CSN: [id number] Arrival date & time: 03/14/20  1331     History No chief complaint on file.   Danny [name] is a 25 y.o. male.  HPI Patient is a 25 year old male with a history of HIV who presents to the emergency department due to atraumatic calf pain.  Patient states that his symptoms started about 3 to 4 days ago.  His symptoms have been waxing and waning.  They are currently in his bilateral calves, left greater than right.  States that his symptoms worsen with ambulation as well as palpation.  No history of blood clots, hemoptysis, cough, chest pain.  States he has felt more lightheaded recently.  No dizziness.  Patient does note he had a 5 to 6-hour car ride just prior to the onset of his symptoms.    Past Medical History:  Diagnosis Date  . HIV (human immunodeficiency virus infection) [facility])     Patient Active Problem List   Diagnosis Date Noted  . Testicular pain 04/14/2019  . Medication monitoring encounter 09/18/2016  . Weight loss 09/18/2016  . Tobacco abuse 06/18/2016  . Human immunodeficiency virus (HIV) disease (HCC) 05/31/2016  . Screening examination for venereal disease 05/31/2016  . Encounter for long-term (current) use of high-risk medication 05/31/2016    History reviewed. No pertinent surgical history.     No family history on file.  Social History   Tobacco Use  . Smoking status: Former Smoker    Types: Cigars  . Smokeless tobacco: Never Used  Substance Use Topics  . Alcohol use: Yes    Comment: occasional  . Drug use: Yes    Types: Marijuana    Comment: occasional    Home Medications Prior to Admission medications   Medication Sig Start Date End Date Taking? Authorizing Provider  BIKTARVY 50-200-25 MG TABS tablet TAKE 1 TABLET BY MOUTH DAILY 02/03/20   Comer, Belia Heman, MD  estradiol (ESTRACE) 1 MG tablet Take 0.5 mg by mouth daily.    [provider]  hydrOXYzine  (ATARAX/VISTARIL) 25 MG tablet Take 1 tablet (25 mg total) by mouth every 6 (six) hours. 05/07/19   Tanda Rockers, PA-C  spironolactone (ALDACTONE) 100 MG tablet Take 100 mg by mouth daily.    [provider]   Allergies    Patient has no known allergies.  Review of Systems   Review of Systems  All other systems reviewed and are negative. Ten systems reviewed and are negative for acute change, except as noted in the HPI.    Physical Exam Updated Vital Signs BP 121/90 (BP Location: Left Arm)   Pulse 69   Temp 98.5 F (36.9 C) (Oral)   Resp 18   Ht 5\' 8"  (1.727 m)   Wt 64.9 kg   SpO2 99%   BMI 21.74 kg/m   Physical Exam Vitals and nursing note reviewed.  Constitutional:      General: He is not in acute distress.    Appearance: Normal appearance. He is not ill-appearing, toxic-appearing or diaphoretic.  HENT:     Head: Normocephalic and atraumatic.     Right Ear: External ear normal.     Left Ear: External ear normal.     Nose: Nose normal.     Mouth/Throat:     Mouth: Mucous membranes are moist.     Pharynx: Oropharynx is clear. No oropharyngeal exudate or posterior oropharyngeal erythema.  Eyes:     Extraocular Movements: Extraocular movements  intact.  Cardiovascular:     Rate and Rhythm: Normal rate and regular rhythm.     Pulses: Normal pulses.     Heart sounds: Normal heart sounds. No murmur heard. No friction rub. No gallop.      Comments: Regular rate and rhythm without murmurs, rubs, or gallops. Pulmonary:     Effort: Pulmonary effort is normal. No respiratory distress.     Breath sounds: Normal breath sounds. No stridor. No wheezing, rhonchi or rales.  Abdominal:     General: Abdomen is flat.     Tenderness: There is no abdominal tenderness.  Musculoskeletal:        General: No tenderness. Normal range of motion.     Cervical back: Normal range of motion and neck supple. No tenderness.     Right lower leg: No edema.     Left lower leg: No edema.      Comments: No visible swelling in the lower extremities.  Palpable pedal pulses.  No pain in the calves with palpation.  No palpable cords.  Distal sensation intact.  Skin:    General: Skin is warm and dry.  Neurological:     General: No focal deficit present.     Mental Status: He is alert and oriented to person, place, and time.  Psychiatric:        Mood and Affect: Mood normal.        Behavior: Behavior normal.    ED Results / Procedures / Treatments   Labs (all labs ordered are listed, but only abnormal results are displayed) Labs Reviewed - No data to display  EKG None  Radiology US Venous Img Lower Bilateral  Result Date: 03/14/2020 CLINICAL DATA:  Bilateral calf pain for 4 days, recent prolonged car trip EXAM: BILATERAL LOWER EXTREMITY VENOUS DOPPLER ULTRASOUND TECHNIQUE: Gray-scale sonography with compression, as well as color and duplex ultrasound, were performed to evaluate the deep venous system(s) from the level of the common femoral vein through the popliteal and proximal calf veins. COMPARISON:  None. FINDINGS: VENOUS Normal compressibility of the common femoral, superficial femoral, and popliteal veins, as well as the visualized calf veins. Visualized portions of profunda femoral vein and great saphenous vein unremarkable. No filling defects to suggest DVT on grayscale or color Doppler imaging. Doppler waveforms show normal direction of venous flow, normal respiratory plasticity and response to augmentation. OTHER None. Limitations: none IMPRESSION: 1. No evidence of deep venous thrombosis within either lower extremity. Electronically Signed   By: Sharlet Salina M.D.   On: 03/14/2020 15:17    Procedures Procedures   Medications Ordered in ED Medications - No data to display  ED Course  I have reviewed the triage vital signs and the nursing notes.  Pertinent labs & imaging results that were available during my care of the patient were reviewed by me and considered  in my medical decision making (see chart for details).  Clinical Course as of 03/14/20 1604  Mon Mar 14, 2020  1530 US Venous Img Lower Bilateral IMPRESSION: 1. No evidence of deep venous thrombosis within either lower extremity. [LJ]    Clinical Course User Index [LJ] Placido Sou, PA-C   MDM Rules/Calculators/A&P                          Pt is a 25 y.o. male who presents the emergency department with bilateral atraumatic calf pain.  Imaging: DVT ultrasound obtained of both lower extremities which were negative  for DVT  I, Placido Sou, PA-C, personally reviewed and evaluated these images and lab results as part of my medical decision-making.  Unsure of the cause of the patient's pain.  Likely musculoskeletal in nature.  DVT is negative.  No leg swelling.  No palpable pain or palpable cords in the legs.  Feel the patient is stable for discharge.  Recommended use of Tylenol and ibuprofen for management of his symptoms.  We discussed the RICE method.  Discussed return precautions.  PCP follow-up.  His questions were answered and he was amicable at the time of discharge.  Note: Portions of this report may have been transcribed using voice recognition software. Every effort was made to ensure accuracy; however, inadvertent computerized transcription errors may be present.   Final Clinical Impression(s) / ED Diagnoses Final diagnoses:  Pain in both lower extremities    Rx / DC Orders ED Discharge Orders    None       Placido Sou, PA-C 03/14/20 1606    Cheryll Cockayne, MD 03/19/20 [phone]

## 2020-03-16 ENCOUNTER — Other Ambulatory Visit: Payer: Self-pay

## 2020-03-16 ENCOUNTER — Emergency Department (HOSPITAL_BASED_OUTPATIENT_CLINIC_OR_DEPARTMENT_OTHER)
Admission: EM | Admit: 2020-03-16 | Discharge: 2020-03-16 | Disposition: A | Discharge status: Home / Self Care | Payer: Self-pay | Attending: Emergency Medicine | Admitting: Emergency Medicine

## 2020-03-16 ENCOUNTER — Telehealth: Payer: Self-pay

## 2020-03-16 ENCOUNTER — Encounter (HOSPITAL_BASED_OUTPATIENT_CLINIC_OR_DEPARTMENT_OTHER): Payer: Self-pay | Admitting: Emergency Medicine

## 2020-03-16 DIAGNOSIS — M79661 Pain in right lower leg: Secondary | ICD-10-CM | POA: Insufficient documentation

## 2020-03-16 DIAGNOSIS — Z79899 Other long term (current) drug therapy: Secondary | ICD-10-CM | POA: Insufficient documentation

## 2020-03-16 DIAGNOSIS — M79604 Pain in right leg: Secondary | ICD-10-CM

## 2020-03-16 DIAGNOSIS — Z87891 Personal history of nicotine dependence: Secondary | ICD-10-CM | POA: Insufficient documentation

## 2020-03-16 DIAGNOSIS — Z21 Asymptomatic human immunodeficiency virus [HIV] infection status: Secondary | ICD-10-CM | POA: Insufficient documentation

## 2020-03-16 NOTE — Telephone Encounter (Signed)
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  Patient states he now having pressure in his right calf muscle.  Patient denies any pain in his leg today.  Patient was also seen in the ED on 03/14/20 for bilateral leg pain. There was no evidence of a DVT.  I have advised the patient that he can go back to the ER or go to UC.  Patient does not have a pcp. Danny [name]

## 2020-03-16 NOTE — ED Provider Notes (Signed)
[facility] EMERGENCY DEPARTMENT Provider Note   CSN: [id number] Arrival date & time: 03/16/20  4098     History Chief Complaint  Patient presents with  . Leg Pain    Danny [name] is a 25 y.o. male history of HIV currently on Biktarvy.  Patient presents with chief complaint of right calf pain.  Patient reports that his pain has continued from the previous day.  Patient reports that his pain is constant, 9/10 on the pain scale, worse with ambulation or palpation, relieved with rest.  Patient reports that he also feels like he is having difficulty walking due to his pain patient reports that he has been walking on his toes to help with this issue.  He denies any traumatic injuries or falls.  Patient does endorse that he give himself estrogen injections.  Last injection was last Tuesday.  Patient also expresses a 5-hour car ride on 2/11.  Per chart review patient was seen on 2/14 and had DVT study performed.  DVT study showed no DVT.    Patient denies any fevers, chills, shortness of breath, chest pain, leg swelling, joint swelling, neck pain, back pain, redness, wound, rash, color change to skin, weakness to extremities, numbness or tingling to extremities, headaches, lightheadedness, dizziness, syncopal episodes.  HPI     Past Medical History:  Diagnosis Date  . HIV (human immunodeficiency virus infection) [facility])     Patient Active Problem List   Diagnosis Date Noted  . Testicular pain 04/14/2019  . Medication monitoring encounter 09/18/2016  . Weight loss 09/18/2016  . Tobacco abuse 06/18/2016  . Human immunodeficiency virus (HIV) disease (HCC) 05/31/2016  . Screening examination for venereal disease 05/31/2016  . Encounter for long-term (current) use of high-risk medication 05/31/2016    History reviewed. No pertinent surgical history.     No family history on file.  Social History   Tobacco Use  . Smoking status: Former Smoker    Types: Cigars  .  Smokeless tobacco: Never Used  Substance Use Topics  . Alcohol use: Yes    Comment: occasional  . Drug use: Yes    Types: Marijuana    Comment: occasional    Home Medications Prior to Admission medications   Medication Sig Start Date End Date Taking? Authorizing Provider  BIKTARVY 50-200-25 MG TABS tablet TAKE 1 TABLET BY MOUTH DAILY 02/03/20   Comer, Belia Heman, MD  estradiol (ESTRACE) 1 MG tablet Take 0.5 mg by mouth daily.    [provider]  hydrOXYzine (ATARAX/VISTARIL) 25 MG tablet Take 1 tablet (25 mg total) by mouth every 6 (six) hours. 05/07/19   Tanda Rockers, PA-C  spironolactone (ALDACTONE) 100 MG tablet Take 100 mg by mouth daily.    [provider]    Allergies    Patient has no known allergies.  Review of Systems   Review of Systems  Constitutional: Negative for chills and fever.  Eyes: Negative for visual disturbance.  Respiratory: Negative for shortness of breath.   Cardiovascular: Negative for chest pain and leg swelling.  Gastrointestinal: Negative for abdominal pain, nausea and vomiting.  Genitourinary: Negative for difficulty urinating and dysuria.  Musculoskeletal: Positive for myalgias. Negative for back pain and neck pain.  Skin: Negative for color change and rash.  Neurological: Negative for dizziness, syncope, weakness, light-headedness, numbness and headaches.  Psychiatric/Behavioral: Negative for confusion.    Physical Exam Updated Vital Signs BP 116/65 (BP Location: Left Arm)   Pulse 81   Temp 98.3 F (36.8  C) (Oral)   Resp 16   Ht 5\' 8"  (1.727 m)   Wt 64.9 kg   SpO2 100%   BMI 21.74 kg/m   Physical Exam Vitals and nursing note reviewed.  Constitutional:      General: He is not in acute distress.    Appearance: He is not ill-appearing, toxic-appearing or diaphoretic.  Eyes:     General: No scleral icterus.       Right eye: No discharge.        Left eye: No discharge.     Extraocular Movements: Extraocular movements  intact.     Pupils: Pupils are equal, round, and reactive to light.  Cardiovascular:     Rate and Rhythm: Normal rate.     Pulses:          Dorsalis pedis pulses are 3+ on the right side and 3+ on the left side.       Posterior tibial pulses are 3+ on the right side and 3+ on the left side.  Pulmonary:     Effort: Pulmonary effort is normal.  Abdominal:     Palpations: Abdomen is soft.     Tenderness: There is no abdominal tenderness.  Musculoskeletal:     Cervical back: Normal range of motion and neck supple. No rigidity.     Right hip: No deformity, lacerations, tenderness or bony tenderness.     Right upper leg: Normal.     Left upper leg: Normal.     Right knee: No swelling, deformity, effusion, erythema, ecchymosis, lacerations, bony tenderness or crepitus. Normal range of motion. No tenderness.     Left knee: No swelling, deformity, effusion, erythema, ecchymosis, lacerations, bony tenderness or crepitus. Normal range of motion. No tenderness.     Right lower leg: Tenderness present. No swelling, deformity, lacerations or bony tenderness. No edema.     Left lower leg: Normal.     Right ankle: No swelling, deformity, ecchymosis or lacerations. No tenderness. Normal range of motion. Normal pulse.     Right Achilles Tendon: Normal.     Left ankle: No swelling, deformity, ecchymosis or lacerations. No tenderness. Normal range of motion. Normal pulse.     Left Achilles Tendon: Normal.     Right foot: Normal range of motion and normal capillary refill. No swelling, deformity, foot drop, laceration, tenderness or bony tenderness. Normal pulse.     Left foot: Normal range of motion and normal capillary refill. No swelling, deformity, foot drop, laceration, tenderness or bony tenderness. Normal pulse.     Comments: Patient has tenderness to right calf, no bony tenderness noted to bilateral foot, ankle, knee, lower extremities.  No erythema, or rash noted to bilateral lower extremity.  No  swelling noted to bilateral lower extremity  Feet:     Right foot:     Skin integrity: No ulcer, blister, skin breakdown, erythema or warmth.     Left foot:     Skin integrity: No ulcer, blister, skin breakdown, erythema or warmth.  Skin:    General: Skin is warm and dry.  Neurological:     General: No focal deficit present.     Mental Status: He is alert.     GCS: GCS eye subscore is 4. GCS verbal subscore is 5. GCS motor subscore is 6.     Cranial Nerves: No cranial nerve deficit or facial asymmetry.     Sensory: Sensation is intact.     Motor: No weakness, tremor, seizure activity or pronator  drift.     Coordination: Romberg sign negative.     Gait: Gait is intact. Gait normal.     Deep Tendon Reflexes:     Reflex Scores:      Patellar reflexes are 2+ on the right side and 2+ on the left side.      Achilles reflexes are 2+ on the right side and 2+ on the left side.    Comments: CN II-XII intact, equal grip strength, +5 strength to bilateral upper and lower extremities   Patient was able to walk in heel toe fashion without difficulty   Psychiatric:        Behavior: Behavior is cooperative.     ED Results / Procedures / Treatments   Labs (all labs ordered are listed, but only abnormal results are displayed) Labs Reviewed - No data to display  EKG None  Radiology US Venous Img Lower Bilateral  Result Date: 03/14/2020 CLINICAL DATA:  Bilateral calf pain for 4 days, recent prolonged car trip EXAM: BILATERAL LOWER EXTREMITY VENOUS DOPPLER ULTRASOUND TECHNIQUE: Gray-scale sonography with compression, as well as color and duplex ultrasound, were performed to evaluate the deep venous system(s) from the level of the common femoral vein through the popliteal and proximal calf veins. COMPARISON:  None. FINDINGS: VENOUS Normal compressibility of the common femoral, superficial femoral, and popliteal veins, as well as the visualized calf veins. Visualized portions of profunda femoral  vein and great saphenous vein unremarkable. No filling defects to suggest DVT on grayscale or color Doppler imaging. Doppler waveforms show normal direction of venous flow, normal respiratory plasticity and response to augmentation. OTHER None. Limitations: none IMPRESSION: 1. No evidence of deep venous thrombosis within either lower extremity. Electronically Signed   By: Sharlet Salina M.D.   On: 03/14/2020 15:17    Procedures Procedures   Medications Ordered in ED Medications - No data to display  ED Course  I have reviewed the triage vital signs and the nursing notes.  Pertinent labs & imaging results that were available during my care of the patient were reviewed by me and considered in my medical decision making (see chart for details).    MDM Rules/Calculators/A&P                          Alert 25 year old male no acute distress, nontoxic-appearing.  Presents with chief complaint of right calf pain.  Patient also reports that due to his pain he has been walking on the balls of his right foot.  Per chart review patient was seen 2 days prior at [facility] for similar complaints of right calf pain.  DVT study was ordered and no DVT was observed.  Patient denies any recent falls or traumatic injuries.  Patient endorses tenderness to right calf.  No bony tenderness noted to bilateral foot, ankle, knee, lower extremities.  No erythema, or rash noted to bilateral lower extremity.  No swelling noted to bilateral lower extremity   No neurological deficit noted on physical exam.  Patient is able to walk and heel toe fashion without difficulty.    Patient's pain is likely musculoskeletal in nature.  Patient was advised to use over-the-counter pain medications for relief of his symptoms.  Patient was given return precautions.  Patient expressed understanding of all instructions and is amenable with this plan.  Final Clinical Impression(s) / ED Diagnoses Final diagnoses:  Right leg pain     Rx / DC Orders ED Discharge  Orders    None       Berneice HeinrichBadalamente, Emmaleigh Longo R, PA-C 03/17/20 0038    Terrilee FilesButler, Michael C, MD 03/17/20 [phone]

## 2020-03-16 NOTE — ED Triage Notes (Addendum)
Pain in rt  Leg and pt feels like it is difficult to walk except on toes  Rt side,. Was seen before for same 2 days ago to rule out DVT ,  States has been giving himself estrogen injections in that rt leg , once a week x 4 , last one last  tuesday

## 2020-03-16 NOTE — Discharge Instructions (Addendum)
You came to the emerge department today to be evaluated for your right lower leg pain.  Your physical exam was reassuring.  The ultrasound of your leg 2 days prior showed no blood clots in your legs.  Pain you are experiencing is likely musculoskeletal in nature.  Please use over-the-counter pain medication to alleviate your symptoms.  Please rest, ice, and elevate your leg.  Please follow-up with primary care provider as needed.  Get help right away if: You have sudden severe pain at or below the area of your injury. You have redness or increased swelling around your injury. You have tingling or numbness at or below the area of your injury and it does not improve after you remove the elastic bandage.

## 2020-06-30 ENCOUNTER — Other Ambulatory Visit: Payer: Self-pay | Admitting: Internal Medicine

## 2020-06-30 ENCOUNTER — Telehealth: Payer: Self-pay

## 2020-06-30 DIAGNOSIS — B20 Human immunodeficiency virus [HIV] disease: Secondary | ICD-10-CM

## 2020-06-30 NOTE — Telephone Encounter (Signed)
Called patient to get scheduled for ADAP renewal, left a voicemail for patient to call us back and get scheduled

## 2020-07-04 ENCOUNTER — Telehealth: Payer: Self-pay

## 2020-07-04 NOTE — Telephone Encounter (Signed)
Called patient to schedule an appointment to renew his adap in July, Patient said he would call back to set the appointment for the month of August.

## 2020-07-31 ENCOUNTER — Other Ambulatory Visit: Payer: Self-pay | Admitting: Internal Medicine

## 2020-07-31 DIAGNOSIS — B20 Human immunodeficiency virus [HIV] disease: Secondary | ICD-10-CM

## 2020-08-02 ENCOUNTER — Telehealth: Payer: Self-pay | Admitting: *Deleted

## 2020-08-02 NOTE — Telephone Encounter (Signed)
Patient unable to refill Biktarvy at [facility] over the weekend. He states they requested refill from [facility] 07/31/20. Clinic sent biktarvy #30 with 2 refills 06/30/20, but [facility] deleted the refills.  Unable to track this further with pharmacy, so RN sent new prescription with refills today. Andree Coss, RN

## 2020-08-03 ENCOUNTER — Ambulatory Visit: Payer: Self-pay

## 2020-08-03 ENCOUNTER — Other Ambulatory Visit: Payer: Self-pay

## 2020-09-07 ENCOUNTER — Other Ambulatory Visit: Payer: Self-pay

## 2020-09-07 ENCOUNTER — Encounter: Payer: Self-pay | Admitting: Internal Medicine

## 2020-09-07 ENCOUNTER — Ambulatory Visit (INDEPENDENT_AMBULATORY_CARE_PROVIDER_SITE_OTHER): Payer: Self-pay | Admitting: Internal Medicine

## 2020-09-07 VITALS — BP 117/79 | HR 75 | Temp 98.1°F | Wt 141.0 lb

## 2020-09-07 DIAGNOSIS — Z113 Encounter for screening for infections with a predominantly sexual mode of transmission: Secondary | ICD-10-CM

## 2020-09-07 DIAGNOSIS — Z720 Tobacco use: Secondary | ICD-10-CM

## 2020-09-07 DIAGNOSIS — B20 Human immunodeficiency virus [HIV] disease: Secondary | ICD-10-CM

## 2020-09-07 NOTE — Assessment & Plan Note (Signed)
Will screen today 

## 2020-09-07 NOTE — Assessment & Plan Note (Signed)
He continues off of tobacco and encouraged continued cessation.

## 2020-09-07 NOTE — Assessment & Plan Note (Addendum)
Will check his CD4 and viral load.  rtc in 6 months unless concerns.   Discussed monkey pox and he is not at risk with one stable partner

## 2020-09-07 NOTE — Progress Notes (Signed)
   Subjective:    Patient ID: Danny [name], male    DOB: 28-Feb-1995, 25 y.o.   MRN: [id number]  HPI Here for follow up of HIV He continues on Biktarvy with no missed doses.  No issues with obtaining, taking or tolerating the medication.  He has no new issues.  Does not endorse any particular pronoun, considered to be non-binary.  Sexually active only with his stable partner.  No additional sexual contacts.     Review of Systems  Constitutional:  Negative for fatigue.  Gastrointestinal:  Negative for diarrhea and nausea.      Objective:   Physical Exam Pulmonary:     Effort: Pulmonary effort is normal.  Skin:    Findings: No rash.  Neurological:     General: No focal deficit present.   SH: no tobacco use now       Assessment & Plan:

## 2020-09-08 LAB — CYTOLOGY, (ORAL, ANAL, URETHRAL) ANCILLARY ONLY
Chlamydia: NEGATIVE
Chlamydia: NEGATIVE
Comment: NEGATIVE
Comment: NEGATIVE
Comment: NORMAL
Comment: NORMAL
Neisseria Gonorrhea: NEGATIVE
Neisseria Gonorrhea: NEGATIVE

## 2020-09-08 LAB — URINE CYTOLOGY ANCILLARY ONLY
Chlamydia: NEGATIVE
Comment: NEGATIVE
Comment: NORMAL
Neisseria Gonorrhea: NEGATIVE

## 2020-09-09 LAB — T-HELPER CELL (CD4) - (RCID CLINIC ONLY)
CD4 % Helper T Cell: 32 % — ABNORMAL LOW (ref 33–65)
CD4 T Cell Abs: 619 /uL (ref 400–1790)

## 2020-09-10 LAB — RPR: RPR Ser Ql: NONREACTIVE

## 2020-09-10 LAB — HIV-1 RNA QUANT-NO REFLEX-BLD
HIV 1 RNA Quant: NOT DETECTED Copies/mL
HIV-1 RNA Quant, Log: NOT DETECTED Log cps/mL

## 2020-09-19 ENCOUNTER — Encounter: Payer: Self-pay | Admitting: Internal Medicine

## 2020-10-25 ENCOUNTER — Other Ambulatory Visit: Payer: Self-pay | Admitting: Internal Medicine

## 2020-10-25 DIAGNOSIS — B20 Human immunodeficiency virus [HIV] disease: Secondary | ICD-10-CM

## 2021-02-09 ENCOUNTER — Encounter: Payer: Self-pay | Admitting: Internal Medicine

## 2021-03-01 ENCOUNTER — Other Ambulatory Visit: Payer: Self-pay

## 2021-03-01 DIAGNOSIS — Z113 Encounter for screening for infections with a predominantly sexual mode of transmission: Secondary | ICD-10-CM

## 2021-03-01 DIAGNOSIS — B20 Human immunodeficiency virus [HIV] disease: Secondary | ICD-10-CM

## 2021-03-01 NOTE — Addendum Note (Signed)
Addended by: Marcell Anger on: 03/01/2021 09:09 AM   Modules accepted: Orders

## 2021-03-02 ENCOUNTER — Ambulatory Visit: Payer: Self-pay

## 2021-03-02 ENCOUNTER — Other Ambulatory Visit: Payer: Self-pay

## 2021-03-02 DIAGNOSIS — B20 Human immunodeficiency virus [HIV] disease: Secondary | ICD-10-CM

## 2021-03-02 DIAGNOSIS — Z113 Encounter for screening for infections with a predominantly sexual mode of transmission: Secondary | ICD-10-CM

## 2021-03-03 LAB — T-HELPER CELLS (CD4) COUNT (NOT AT ARMC)
CD4 % Helper T Cell: 35 % (ref 33–65)
CD4 T Cell Abs: 494 /uL (ref 400–1790)

## 2021-03-06 LAB — CBC WITH DIFFERENTIAL/PLATELET
Absolute Monocytes: 356 cells/uL (ref 200–950)
Basophils Absolute: 20 cells/uL (ref 0–200)
Basophils Relative: 0.5 %
Eosinophils Absolute: 12 cells/uL — ABNORMAL LOW (ref 15–500)
Eosinophils Relative: 0.3 %
HCT: 44.7 % (ref 38.5–50.0)
Hemoglobin: 15.1 g/dL (ref 13.2–17.1)
Lymphs Abs: 1396 cells/uL (ref 850–3900)
MCH: 31.9 pg (ref 27.0–33.0)
MCHC: 33.8 g/dL (ref 32.0–36.0)
MCV: 94.3 fL (ref 80.0–100.0)
MPV: 11.4 fL (ref 7.5–12.5)
Monocytes Relative: 8.9 %
Neutro Abs: 2216 cells/uL (ref 1500–7800)
Neutrophils Relative %: 55.4 %
Platelets: 198 10*3/uL (ref 140–400)
RBC: 4.74 10*6/uL (ref 4.20–5.80)
RDW: 12.8 % (ref 11.0–15.0)
Total Lymphocyte: 34.9 %
WBC: 4 10*3/uL (ref 3.8–10.8)

## 2021-03-06 LAB — COMPLETE METABOLIC PANEL WITH GFR
AG Ratio: 1.5 (calc) (ref 1.0–2.5)
ALT: 28 U/L (ref 9–46)
AST: 19 U/L (ref 10–40)
Albumin: 4.7 g/dL (ref 3.6–5.1)
Alkaline phosphatase (APISO): 46 U/L (ref 36–130)
BUN: 15 mg/dL (ref 7–25)
CO2: 25 mmol/L (ref 20–32)
Calcium: 9.6 mg/dL (ref 8.6–10.3)
Chloride: 103 mmol/L (ref 98–110)
Creat: 0.81 mg/dL (ref 0.60–1.24)
Globulin: 3.1 g/dL (calc) (ref 1.9–3.7)
Glucose, Bld: 103 mg/dL — ABNORMAL HIGH (ref 65–99)
Potassium: 3.6 mmol/L (ref 3.5–5.3)
Sodium: 136 mmol/L (ref 135–146)
Total Bilirubin: 0.8 mg/dL (ref 0.2–1.2)
Total Protein: 7.8 g/dL (ref 6.1–8.1)
eGFR: 127 mL/min/{1.73_m2} (ref >=60)

## 2021-03-06 LAB — LIPID PANEL
Cholesterol: 166 mg/dL (ref <200)
HDL: 70 mg/dL (ref >=40)
LDL Cholesterol (Calc): 82 mg/dL (calc)
Non-HDL Cholesterol (Calc): 96 mg/dL (calc) (ref <130)
Total CHOL/HDL Ratio: 2.4 (calc) (ref <5.0)
Triglycerides: 49 mg/dL (ref <150)

## 2021-03-06 LAB — C. TRACHOMATIS/N. GONORRHOEAE RNA
C. trachomatis RNA, TMA: NOT DETECTED
N. gonorrhoeae RNA, TMA: NOT DETECTED

## 2021-03-06 LAB — HIV-1 RNA QUANT-NO REFLEX-BLD
HIV 1 RNA Quant: NOT DETECTED Copies/mL
HIV-1 RNA Quant, Log: NOT DETECTED Log cps/mL

## 2021-03-06 LAB — RPR: RPR Ser Ql: NONREACTIVE

## 2021-03-15 ENCOUNTER — Telehealth: Payer: Self-pay

## 2021-03-15 NOTE — Telephone Encounter (Signed)
Patient called concerned about lab result of glucose 103. He reports that he was not fasting before the test and that he'd eaten [employer] just prior. Explained to him that this slightly elevated result is to be expected if he was not fasting.   Relayed that STI testing was negative and lipid panel was normal. Patient verbalized understanding and has no further questions.   Sandie Ano, RN

## 2021-03-16 ENCOUNTER — Ambulatory Visit (INDEPENDENT_AMBULATORY_CARE_PROVIDER_SITE_OTHER): Payer: Self-pay | Admitting: Internal Medicine

## 2021-03-16 ENCOUNTER — Encounter: Payer: Self-pay | Admitting: Internal Medicine

## 2021-03-16 ENCOUNTER — Other Ambulatory Visit: Payer: Self-pay

## 2021-03-16 VITALS — BP 122/74 | HR 79 | Temp 98.1°F | Resp 16 | Ht 68.0 in | Wt 138.0 lb

## 2021-03-16 DIAGNOSIS — Z23 Encounter for immunization: Secondary | ICD-10-CM

## 2021-03-16 DIAGNOSIS — B20 Human immunodeficiency virus [HIV] disease: Secondary | ICD-10-CM

## 2021-03-16 DIAGNOSIS — Z5181 Encounter for therapeutic drug level monitoring: Secondary | ICD-10-CM

## 2021-03-16 DIAGNOSIS — Z79899 Other long term (current) drug therapy: Secondary | ICD-10-CM

## 2021-03-16 DIAGNOSIS — Z113 Encounter for screening for infections with a predominantly sexual mode of transmission: Secondary | ICD-10-CM

## 2021-03-16 NOTE — Addendum Note (Signed)
Addended by: Marcell Anger on: 03/16/2021 02:46 PM   Modules accepted: Orders

## 2021-03-16 NOTE — Assessment & Plan Note (Signed)
Screened negative. Has a stable partner now

## 2021-03-16 NOTE — Assessment & Plan Note (Signed)
Lipid panel noted and no concerns.  

## 2021-03-16 NOTE — Progress Notes (Signed)
° °  Subjective:    Patient ID: Danny [name], male    DOB: [birth year], 26 y.o.   MRN: [id number]  HPI Here for follow up of HIV He continues on Biktarvy and denies any missed doses.  CD4 494, viral load remains not dtected.  No issues with getting, taking or tolerating the medication.     Review of Systems  Constitutional:  Negative for fatigue.  Gastrointestinal:  Negative for diarrhea and nausea.  Skin:  Negative for rash.      Objective:   Physical Exam Eyes:     General: No scleral icterus. Pulmonary:     Effort: Pulmonary effort is normal.  Skin:    Findings: No rash.  Neurological:     General: No focal deficit present.     Mental Status: He is alert.  Psychiatric:        Mood and Affect: Mood normal.    SH: no current tobacco      Assessment & Plan:

## 2021-03-16 NOTE — Assessment & Plan Note (Signed)
Creat, LFTs wnl.  Glucose up mildly, was not fasting.

## 2021-03-16 NOTE — Assessment & Plan Note (Signed)
He is doing well, no concerns on labs or by history.  He will continue with Biktarvy and rtc in 6 months.  He prefers getting labs same day as the appointment to reduce the amount of times he needs to come in to clinic.

## 2021-04-16 ENCOUNTER — Other Ambulatory Visit: Payer: Self-pay | Admitting: Internal Medicine

## 2021-04-16 DIAGNOSIS — B20 Human immunodeficiency virus [HIV] disease: Secondary | ICD-10-CM

## 2021-06-15 IMAGING — DX DG CHEST 1V PORT
1 series · 1 of 1 positions shown · non-contrast
Comparison: None.

CLINICAL DATA: Cough.

EXAM:
PORTABLE CHEST 1 VIEW

[chest ap]
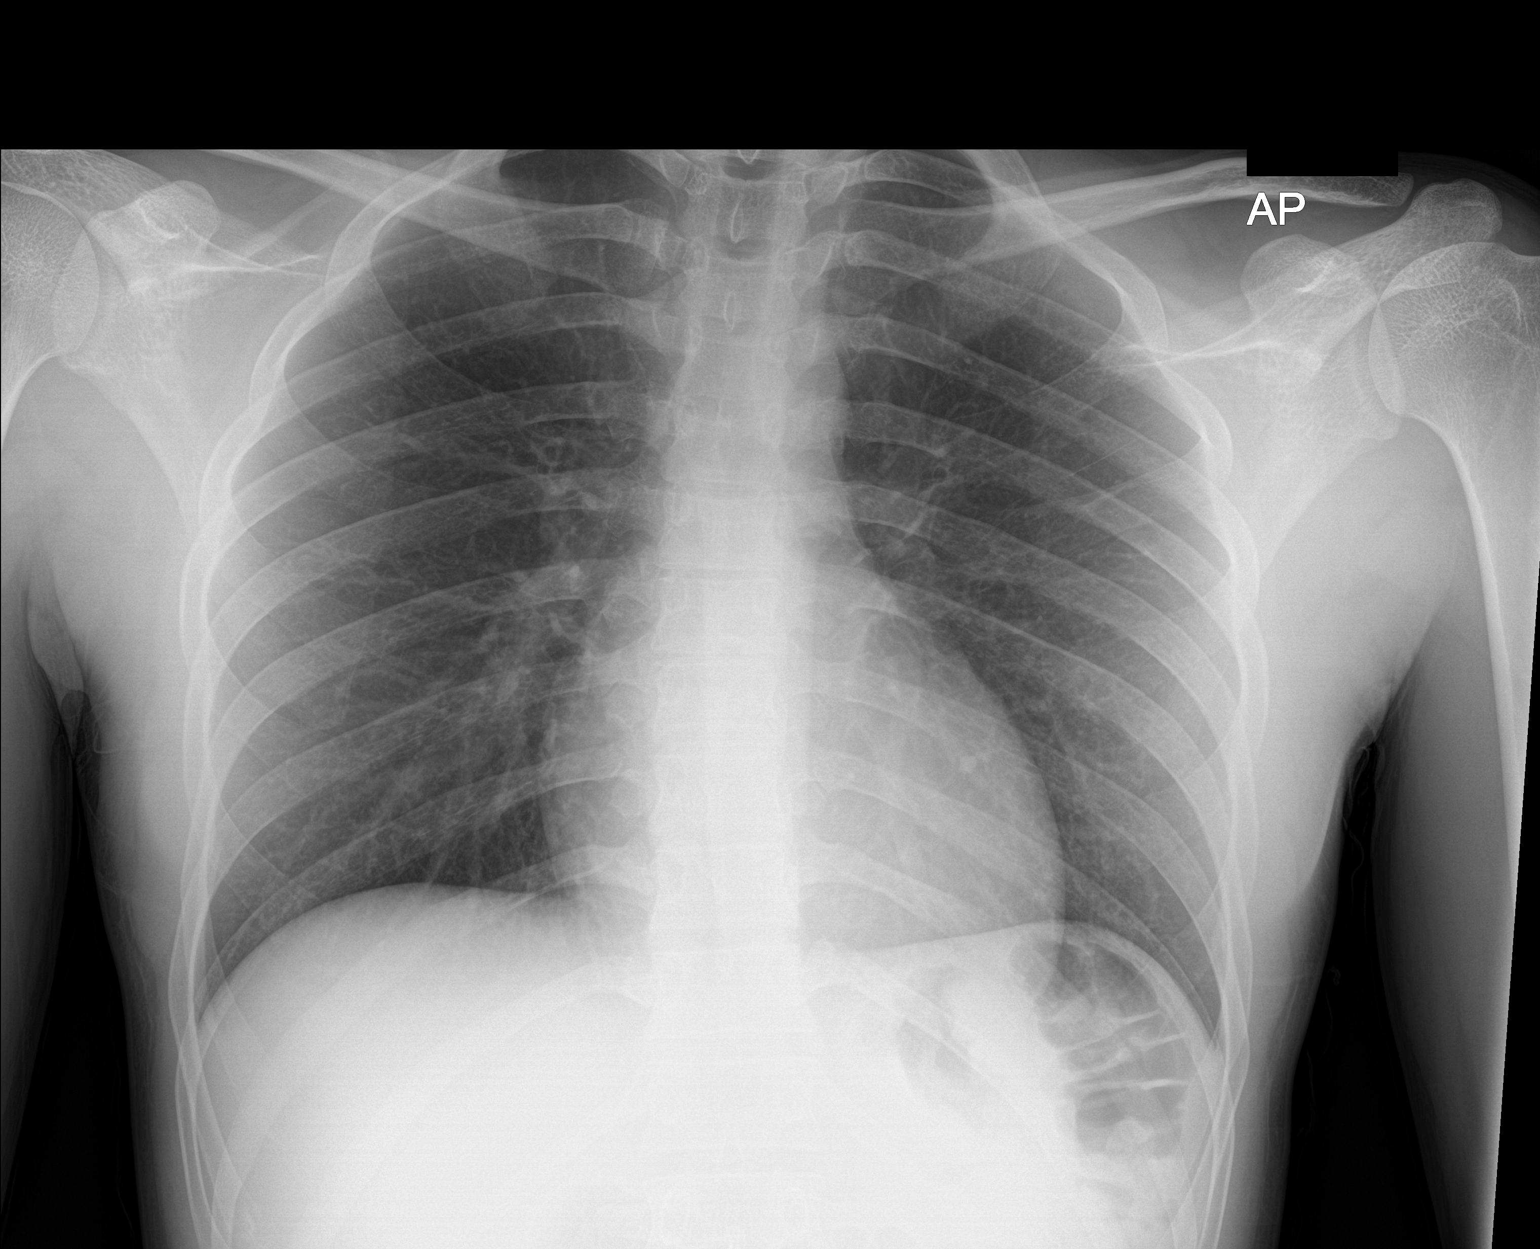

[1 of 1 positions shown; findings below may reference images not displayed]

FINDINGS: The heart size and mediastinal contours are within normal limits.
Both lungs are clear. The visualized skeletal structures are
unremarkable. Incidentally noted is a right-sided cervical rib.
IMPRESSION: No active disease.

## 2021-07-26 IMAGING — US US EXTREM LOW VENOUS
1 series · 14 of 24 positions shown · non-contrast
Comparison: None.

CLINICAL DATA: Bilateral calf pain for 4 days, recent prolonged car
trip

EXAM:
BILATERAL LOWER EXTREMITY VENOUS DOPPLER ULTRASOUND
TECHNIQUE: Gray-scale sonography with compression, as well as color and duplex
ultrasound, were performed to evaluate the deep venous system(s)
from the level of the common femoral vein through the popliteal and
proximal calf veins.

[Series 1: us extrem low venous · 14 of 71 slices shown]
[im 1/71]
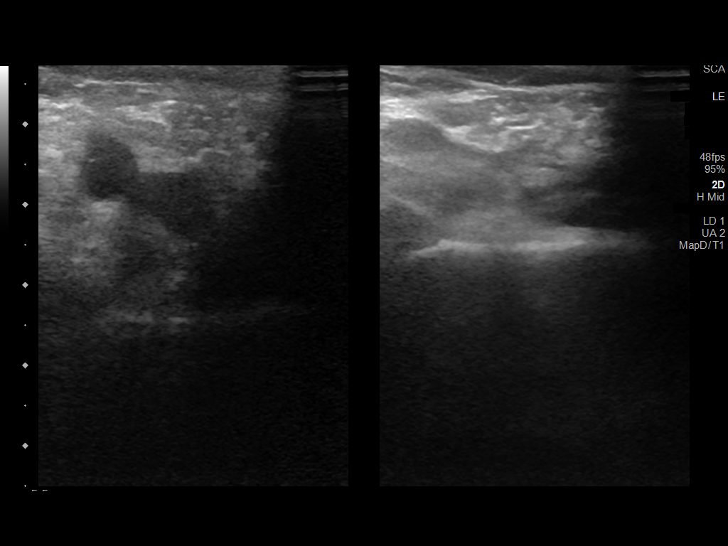
[im 7/71]
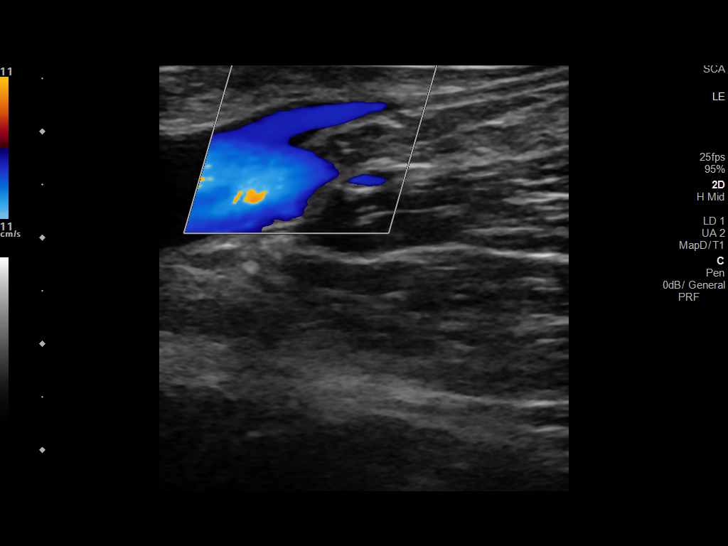
[im 13/71]
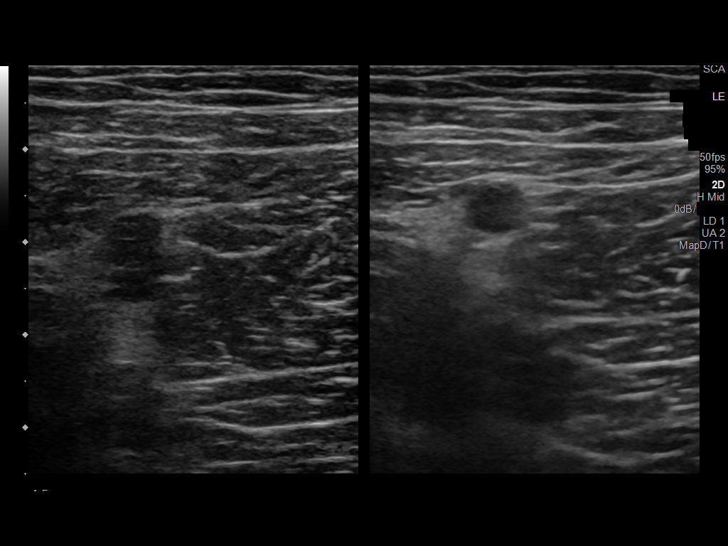
[im 19/71]
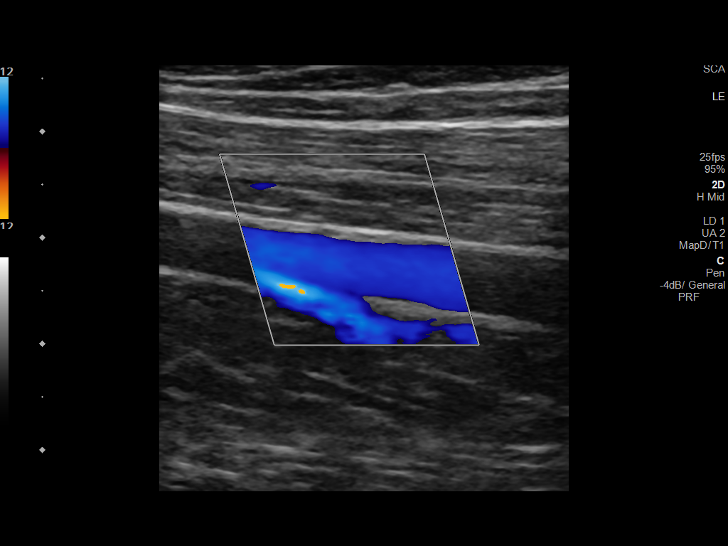
[im 22/71]
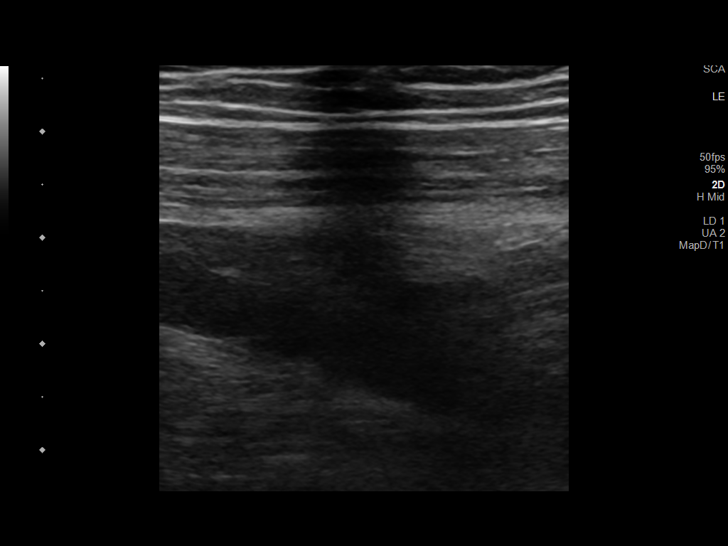
[im 28/71]
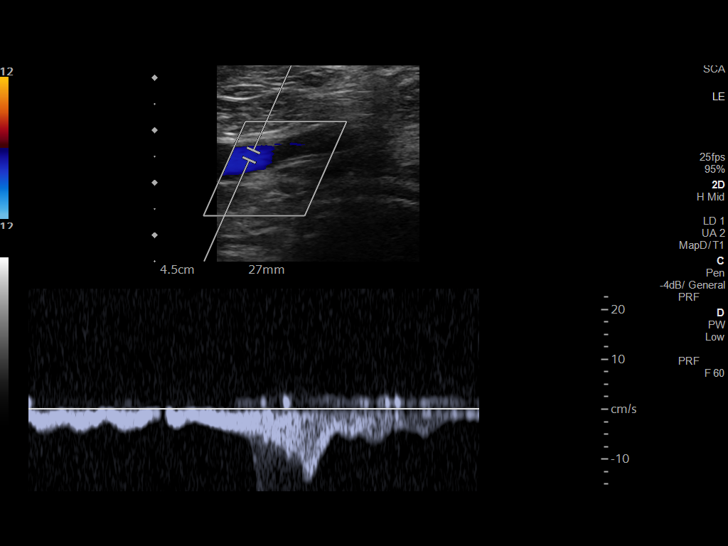
[im 34/71]
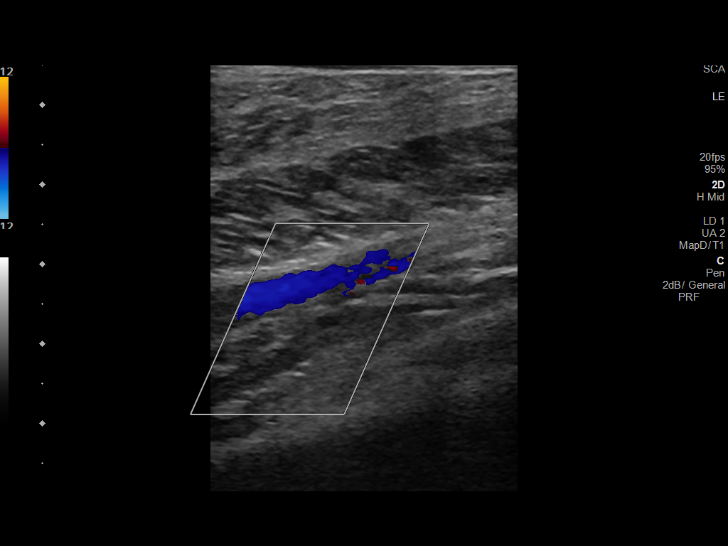
[im 37/71]
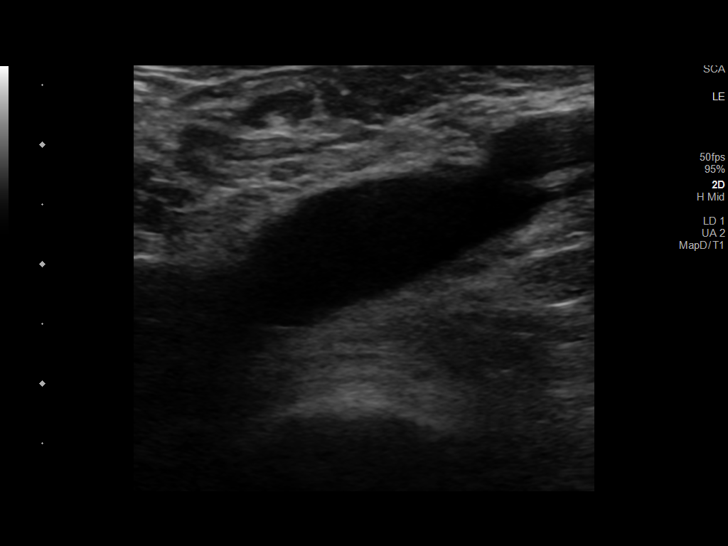
[im 43/71]
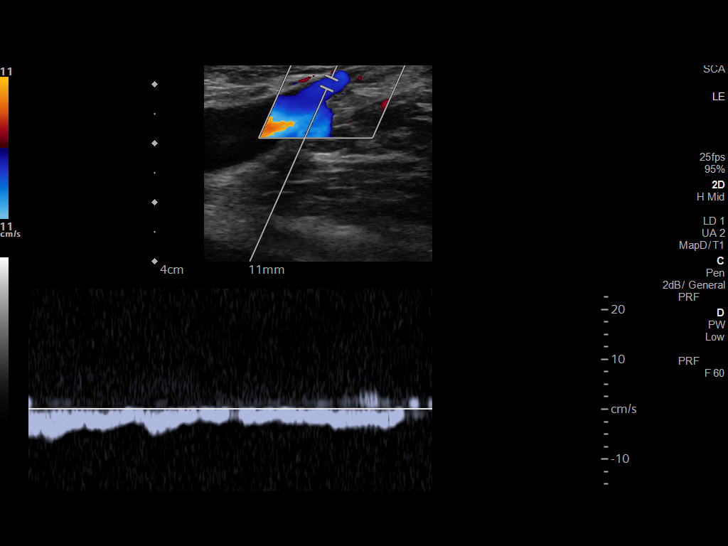
[im 49/71]
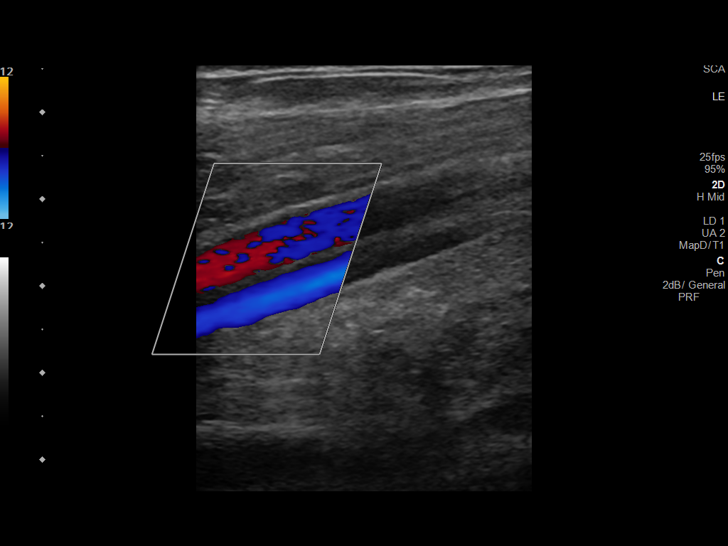
[im 55/71]
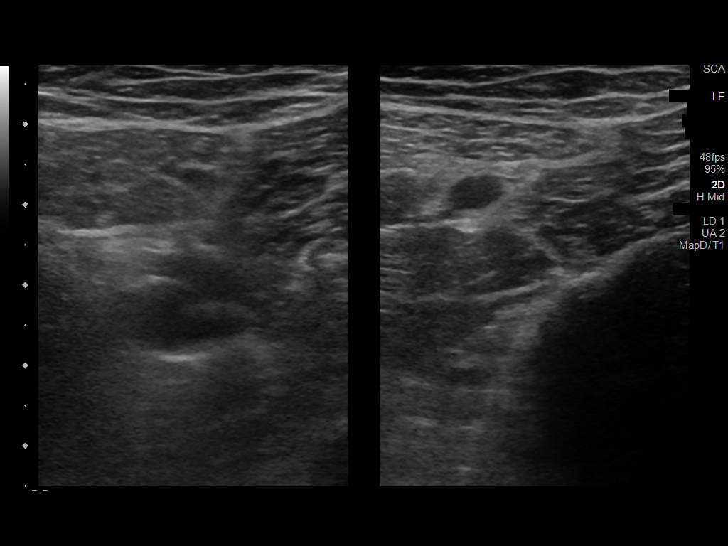
[im 58/71]
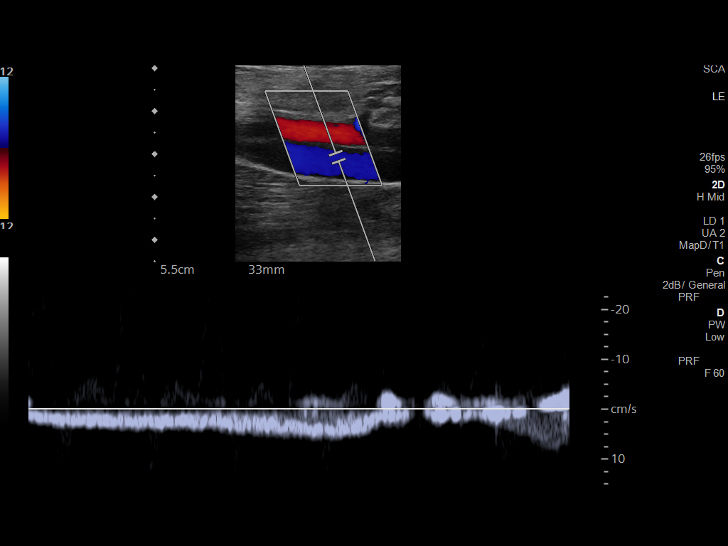
[im 64/71]
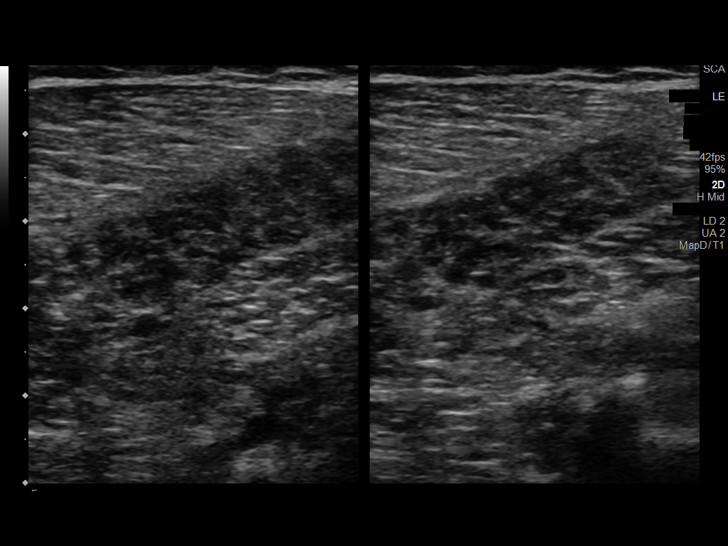
[im 71/71]
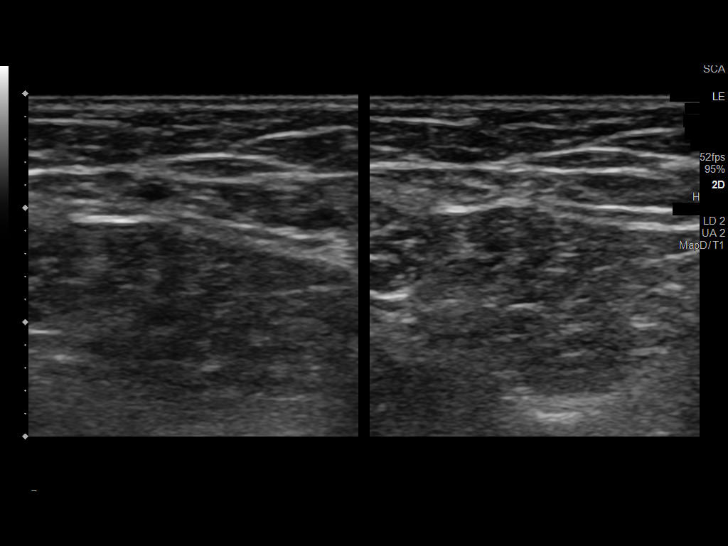

[14 of 24 positions shown; findings below may reference images not displayed]

FINDINGS: VENOUS

Normal compressibility of the common femoral, superficial femoral,
and popliteal veins, as well as the visualized calf veins.
Visualized portions of profunda femoral vein and great saphenous
vein unremarkable. No filling defects to suggest DVT on grayscale or
color Doppler imaging. Doppler waveforms show normal direction of
venous flow, normal respiratory plasticity and response to
augmentation.

OTHER

None.

Limitations: none
IMPRESSION: 1. No evidence of deep venous thrombosis within either lower
extremity.

## 2021-09-12 ENCOUNTER — Other Ambulatory Visit: Payer: Self-pay | Admitting: Internal Medicine

## 2021-09-12 DIAGNOSIS — B20 Human immunodeficiency virus [HIV] disease: Secondary | ICD-10-CM

## 2021-09-14 ENCOUNTER — Ambulatory Visit (INDEPENDENT_AMBULATORY_CARE_PROVIDER_SITE_OTHER): Payer: Self-pay | Admitting: Internal Medicine

## 2021-09-14 ENCOUNTER — Encounter: Payer: Self-pay | Admitting: Internal Medicine

## 2021-09-14 ENCOUNTER — Other Ambulatory Visit: Payer: Self-pay

## 2021-09-14 VITALS — BP 135/81 | HR 85 | Temp 98.1°F | Ht 67.0 in | Wt 141.0 lb

## 2021-09-14 DIAGNOSIS — B20 Human immunodeficiency virus [HIV] disease: Secondary | ICD-10-CM

## 2021-09-14 DIAGNOSIS — Z720 Tobacco use: Secondary | ICD-10-CM

## 2021-09-14 NOTE — Assessment & Plan Note (Signed)
Has quit smoking.  

## 2021-09-14 NOTE — Progress Notes (Signed)
   Subjective:    Patient ID: Danny [name], male    DOB: 1995/08/31, 26 y.o.   MRN: [id number]  HPI Here for follow up of HIV Exavior is taking Biktarvy and denies any missed doses.  No new issues.  She is transitioning to MTF with estradiol and progesterone provided by [facility]. Feels more appropriate and happy about the transition.     Review of Systems  Constitutional:  Negative for fatigue.  Gastrointestinal:  Negative for diarrhea and nausea.  Skin:  Negative for rash.       Objective:   Physical Exam Eyes:     General: No scleral icterus. Pulmonary:     Effort: Pulmonary effort is normal.  Neurological:     Mental Status: He is alert.  Psychiatric:        Mood and Affect: Mood normal.   SH: no tobacco        Assessment & Plan:

## 2021-09-14 NOTE — Assessment & Plan Note (Signed)
Doing well and reviewed previous labs.  Will check today and can rtc in 6 months.

## 2021-09-15 LAB — T-HELPER CELL (CD4) - (RCID CLINIC ONLY)
CD4 % Helper T Cell: 33 % (ref 33–65)
CD4 T Cell Abs: 580 /uL (ref 400–1790)

## 2021-09-18 LAB — HIV-1 RNA QUANT-NO REFLEX-BLD
HIV 1 RNA Quant: NOT DETECTED Copies/mL
HIV-1 RNA Quant, Log: NOT DETECTED Log cps/mL

## 2021-10-11 ENCOUNTER — Other Ambulatory Visit: Payer: Self-pay | Admitting: Internal Medicine

## 2021-10-11 DIAGNOSIS — B20 Human immunodeficiency virus [HIV] disease: Secondary | ICD-10-CM

## 2022-03-06 ENCOUNTER — Ambulatory Visit (INDEPENDENT_AMBULATORY_CARE_PROVIDER_SITE_OTHER): Payer: Self-pay | Admitting: Internal Medicine

## 2022-03-06 ENCOUNTER — Other Ambulatory Visit: Payer: Self-pay

## 2022-03-06 ENCOUNTER — Encounter: Payer: Self-pay | Admitting: Internal Medicine

## 2022-03-06 VITALS — BP 127/86 | HR 92 | Resp 16 | Ht 67.0 in | Wt 144.0 lb

## 2022-03-06 DIAGNOSIS — Z79899 Other long term (current) drug therapy: Secondary | ICD-10-CM

## 2022-03-06 DIAGNOSIS — B20 Human immunodeficiency virus [HIV] disease: Secondary | ICD-10-CM

## 2022-03-06 DIAGNOSIS — Z5181 Encounter for therapeutic drug level monitoring: Secondary | ICD-10-CM

## 2022-03-06 DIAGNOSIS — F649 Gender identity disorder, unspecified: Secondary | ICD-10-CM | POA: Insufficient documentation

## 2022-03-06 DIAGNOSIS — Z113 Encounter for screening for infections with a predominantly sexual mode of transmission: Secondary | ICD-10-CM

## 2022-03-06 MED ORDER — BIKTARVY 50-200-25 MG PO TABS: 1.0000 | ORAL_TABLET | Freq: Every day | ORAL | 11 refills | Status: DC

## 2022-03-06 NOTE — Assessment & Plan Note (Signed)
Doing well on Biktarvy and previous labs reviewed.  No changes indicated and will check labs today and can rtc in 6 months otherwise

## 2022-03-06 NOTE — Progress Notes (Signed)
   Subjective:    Patient ID: Danny [name], male    DOB: May 18, 1995, 27 y.o.   MRN: [id number]  HPI Danny [name] is here for follow up of HIV She continues her transition with [facility] and pleased with the results.  No concerns today and no complaints.     Review of Systems  Constitutional:  Negative for fatigue.  Gastrointestinal:  Negative for diarrhea.  Skin:  Negative for rash.       Objective:   Physical Exam Eyes:     General: No scleral icterus. Pulmonary:     Effort: Pulmonary effort is normal.  Skin:    Findings: No rash.  Neurological:     Mental Status: Danny [name] is alert.   SH; no current tobacco use        Assessment & Plan:

## 2022-03-06 NOTE — Assessment & Plan Note (Signed)
Will check the lipid panel

## 2022-03-06 NOTE — Assessment & Plan Note (Signed)
She continues on hormone therapy and pleased with the transition.   Followed by PP

## 2022-03-06 NOTE — Assessment & Plan Note (Signed)
Will screen

## 2022-03-07 LAB — T-HELPER CELL (CD4) - (RCID CLINIC ONLY)
CD4 % Helper T Cell: 35 % (ref 33–65)
CD4 T Cell Abs: 569 /uL (ref 400–1790)

## 2022-03-07 LAB — CYTOLOGY, (ORAL, ANAL, URETHRAL) ANCILLARY ONLY
Chlamydia: NEGATIVE
Chlamydia: NEGATIVE
Comment: NEGATIVE
Comment: NEGATIVE
Comment: NORMAL
Comment: NORMAL
Neisseria Gonorrhea: NEGATIVE
Neisseria Gonorrhea: NEGATIVE

## 2022-03-07 LAB — URINE CYTOLOGY ANCILLARY ONLY
Chlamydia: NEGATIVE
Comment: NEGATIVE
Comment: NORMAL
Neisseria Gonorrhea: NEGATIVE

## 2022-03-08 LAB — COMPLETE METABOLIC PANEL WITH GFR
AG Ratio: 1.3 (calc) (ref 1.0–2.5)
ALT: 12 U/L (ref 9–46)
AST: 14 U/L (ref 10–40)
Albumin: 4.3 g/dL (ref 3.6–5.1)
Alkaline phosphatase (APISO): 57 U/L (ref 36–130)
BUN: 13 mg/dL (ref 7–25)
CO2: 26 mmol/L (ref 20–32)
Calcium: 9.4 mg/dL (ref 8.6–10.3)
Chloride: 103 mmol/L (ref 98–110)
Creat: 0.7 mg/dL (ref 0.60–1.24)
Globulin: 3.2 g/dL (calc) (ref 1.9–3.7)
Glucose, Bld: 84 mg/dL (ref 65–99)
Potassium: 3.2 mmol/L — ABNORMAL LOW (ref 3.5–5.3)
Sodium: 137 mmol/L (ref 135–146)
Total Bilirubin: 0.5 mg/dL (ref 0.2–1.2)
Total Protein: 7.5 g/dL (ref 6.1–8.1)
eGFR: 132 mL/min/{1.73_m2} (ref >=60)

## 2022-03-08 LAB — CBC WITH DIFFERENTIAL/PLATELET
Absolute Monocytes: 391 cells/uL (ref 200–950)
Basophils Absolute: 32 cells/uL (ref 0–200)
Basophils Relative: 0.7 %
Eosinophils Absolute: 18 cells/uL (ref 15–500)
Eosinophils Relative: 0.4 %
HCT: 37.9 % — ABNORMAL LOW (ref 38.5–50.0)
Hemoglobin: 13.2 g/dL (ref 13.2–17.1)
Lymphs Abs: 1808 cells/uL (ref 850–3900)
MCH: 32.7 pg (ref 27.0–33.0)
MCHC: 34.8 g/dL (ref 32.0–36.0)
MCV: 93.8 fL (ref 80.0–100.0)
MPV: 11.6 fL (ref 7.5–12.5)
Monocytes Relative: 8.5 %
Neutro Abs: 2351 cells/uL (ref 1500–7800)
Neutrophils Relative %: 51.1 %
Platelets: 216 10*3/uL (ref 140–400)
RBC: 4.04 10*6/uL — ABNORMAL LOW (ref 4.20–5.80)
RDW: 12.9 % (ref 11.0–15.0)
Total Lymphocyte: 39.3 %
WBC: 4.6 10*3/uL (ref 3.8–10.8)

## 2022-03-08 LAB — LIPID PANEL
Cholesterol: 160 mg/dL (ref <200)
HDL: 69 mg/dL (ref >=40)
LDL Cholesterol (Calc): 68 mg/dL (calc)
Non-HDL Cholesterol (Calc): 91 mg/dL (calc) (ref <130)
Total CHOL/HDL Ratio: 2.3 (calc) (ref <5.0)
Triglycerides: 150 mg/dL — ABNORMAL HIGH (ref <150)

## 2022-03-08 LAB — HIV-1 RNA QUANT-NO REFLEX-BLD
HIV 1 RNA Quant: NOT DETECTED Copies/mL
HIV-1 RNA Quant, Log: NOT DETECTED Log cps/mL

## 2022-03-08 LAB — RPR: RPR Ser Ql: NONREACTIVE

## 2022-04-04 ENCOUNTER — Other Ambulatory Visit: Payer: Self-pay | Admitting: Internal Medicine

## 2022-04-04 DIAGNOSIS — B20 Human immunodeficiency virus [HIV] disease: Secondary | ICD-10-CM

## 2022-09-04 ENCOUNTER — Ambulatory Visit (INDEPENDENT_AMBULATORY_CARE_PROVIDER_SITE_OTHER): Payer: Self-pay | Admitting: Internal Medicine

## 2022-09-04 ENCOUNTER — Encounter: Payer: Self-pay | Admitting: Internal Medicine

## 2022-09-04 ENCOUNTER — Other Ambulatory Visit: Payer: Self-pay

## 2022-09-04 VITALS — BP 131/86 | HR 88 | Temp 98.1°F | Ht 67.0 in | Wt 173.0 lb

## 2022-09-04 DIAGNOSIS — B20 Human immunodeficiency virus [HIV] disease: Secondary | ICD-10-CM

## 2022-09-04 NOTE — Progress Notes (Signed)
   Subjective:    Patient ID: Ilda Foil, adult    DOB: [birth year], 27 y.o.   MRN: [id number]  HPI Harl is here for follow up of HIV She continues on Biktarvy with no missed doses.  No new concerns.  No issues with getting or taking the medication.  Has a stable partner with recent negative STI testing.    Review of Systems  Constitutional:  Negative for fatigue.  Gastrointestinal:  Negative for diarrhea and nausea.  Skin:  Negative for rash.       Objective:   Physical Exam Eyes:     General: No scleral icterus. Pulmonary:     Effort: Pulmonary effort is normal.  Neurological:     Mental Status: Allyson is alert.   SH: no tobacco        Assessment & Plan:

## 2022-09-04 NOTE — Assessment & Plan Note (Addendum)
Doing well on Biktarvy and no concerns.  Reviewed previous labs and will check today. Rtc in 6 months.   I have personally spent 33 minutes involved in face-to-face and non-face-to-face activities for this patient on the day of the visit. Professional time spent includes the following activities: Preparing to see the patient (review of tests), Obtaining and/or reviewing separately obtained history (admission/discharge record), Performing a medically appropriate examination and/or evaluation , Ordering medications/tests/procedures, referring and communicating with other health care professionals, Documenting clinical information in the EMR, Independently interpreting results (not separately reported), Communicating results to the patient/family/caregiver, Counseling and educating the patient/family/caregiver and Care coordination (not separately reported).

## 2023-03-06 ENCOUNTER — Ambulatory Visit (INDEPENDENT_AMBULATORY_CARE_PROVIDER_SITE_OTHER): Payer: Self-pay | Admitting: Internal Medicine

## 2023-03-06 ENCOUNTER — Encounter: Payer: Self-pay | Admitting: Internal Medicine

## 2023-03-06 ENCOUNTER — Other Ambulatory Visit: Payer: Self-pay

## 2023-03-06 VITALS — BP 142/82 | HR 99 | Temp 98.4°F | Ht 67.0 in | Wt 175.0 lb

## 2023-03-06 DIAGNOSIS — Z23 Encounter for immunization: Secondary | ICD-10-CM | POA: Insufficient documentation

## 2023-03-06 DIAGNOSIS — Z113 Encounter for screening for infections with a predominantly sexual mode of transmission: Secondary | ICD-10-CM

## 2023-03-06 DIAGNOSIS — B20 Human immunodeficiency virus [HIV] disease: Secondary | ICD-10-CM

## 2023-03-06 DIAGNOSIS — Z79899 Other long term (current) drug therapy: Secondary | ICD-10-CM

## 2023-03-06 MED ORDER — BIKTARVY 50-200-25 MG PO TABS: 1.0000 | ORAL_TABLET | Freq: Every day | ORAL | 11 refills | Status: AC

## 2023-03-06 NOTE — Assessment & Plan Note (Signed)
Check the lipid panel

## 2023-03-06 NOTE — Progress Notes (Signed)
   Subjective:    Patient ID: Danny [name], adult    DOB: Aug 28, 1995, 28 y.o.   MRN: [id number]  HPI Danny [name] is for follow-up of HIV. She continues on Biktarvy  with no missed doses.  No issues with getting or taking her medication.  She also continues on estradiol.  No concerns today.   Review of Systems  Constitutional:  Negative for fatigue.  Gastrointestinal:  Negative for diarrhea and nausea.  Skin:  Negative for rash.       Objective:   Physical Exam Eyes:     General: No scleral icterus. Pulmonary:     Effort: Pulmonary effort is normal.  Neurological:     Mental Status: Danny [name] is alert.   SH: no tobacco        Assessment & Plan:

## 2023-03-06 NOTE — Assessment & Plan Note (Signed)
Will do routine screening.  Low risk with a stable partner.  She declined swab testing.

## 2023-03-06 NOTE — Assessment & Plan Note (Signed)
 Continues to do well with no concerns.  Previous labs reviewed.  Will check labs again today and otherwise can return in 6 months

## 2023-03-06 NOTE — Assessment & Plan Note (Signed)
Declined vaccines today

## 2023-03-07 LAB — T-HELPER CELL (CD4) - (RCID CLINIC ONLY)
CD4 % Helper T Cell: 35 % (ref 33–65)
CD4 T Cell Abs: 656 /uL (ref 400–1790)

## 2023-03-07 LAB — URINE CYTOLOGY ANCILLARY ONLY
Chlamydia: NEGATIVE
Comment: NEGATIVE
Comment: NORMAL
Neisseria Gonorrhea: NEGATIVE

## 2023-03-08 LAB — COMPLETE METABOLIC PANEL WITH GFR
AG Ratio: 1.6 (calc) (ref 1.0–2.5)
ALT: 16 U/L (ref 9–46)
AST: 16 U/L (ref 10–40)
Albumin: 4.8 g/dL (ref 3.6–5.1)
Alkaline phosphatase (APISO): 63 U/L (ref 36–130)
BUN: 14 mg/dL (ref 7–25)
CO2: 25 mmol/L (ref 20–32)
Calcium: 9.7 mg/dL (ref 8.6–10.3)
Chloride: 105 mmol/L (ref 98–110)
Creat: 0.96 mg/dL (ref 0.60–1.24)
Globulin: 3 g/dL (ref 1.9–3.7)
Glucose, Bld: 88 mg/dL (ref 65–99)
Potassium: 3.6 mmol/L (ref 3.5–5.3)
Sodium: 138 mmol/L (ref 135–146)
Total Bilirubin: 0.6 mg/dL (ref 0.2–1.2)
Total Protein: 7.8 g/dL (ref 6.1–8.1)
eGFR: 112 mL/min/{1.73_m2} (ref >=60)

## 2023-03-08 LAB — CBC WITH DIFFERENTIAL/PLATELET
Absolute Lymphocytes: 1944 {cells}/uL (ref 850–3900)
Absolute Monocytes: 427 {cells}/uL (ref 200–950)
Basophils Absolute: 10 {cells}/uL (ref 0–200)
Basophils Relative: 0.2 %
Eosinophils Absolute: 29 {cells}/uL (ref 15–500)
Eosinophils Relative: 0.6 %
HCT: 47.9 % (ref 38.5–50.0)
Hemoglobin: 16.2 g/dL (ref 13.2–17.1)
MCH: 31.3 pg (ref 27.0–33.0)
MCHC: 33.8 g/dL (ref 32.0–36.0)
MCV: 92.5 fL (ref 80.0–100.0)
MPV: 11.7 fL (ref 7.5–12.5)
Monocytes Relative: 8.9 %
Neutro Abs: 2390 {cells}/uL (ref 1500–7800)
Neutrophils Relative %: 49.8 %
Platelets: 221 10*3/uL (ref 140–400)
RBC: 5.18 10*6/uL (ref 4.20–5.80)
RDW: 13.5 % (ref 11.0–15.0)
Total Lymphocyte: 40.5 %
WBC: 4.8 10*3/uL (ref 3.8–10.8)

## 2023-03-08 LAB — HIV-1 RNA QUANT-NO REFLEX-BLD
HIV 1 RNA Quant: NOT DETECTED {copies}/mL
HIV-1 RNA Quant, Log: NOT DETECTED {Log}

## 2023-03-08 LAB — LIPID PANEL
Cholesterol: 170 mg/dL (ref <200)
HDL: 55 mg/dL (ref >=40)
LDL Cholesterol (Calc): 100 mg/dL — ABNORMAL HIGH
Non-HDL Cholesterol (Calc): 115 mg/dL (ref <130)
Total CHOL/HDL Ratio: 3.1 (calc) (ref <5.0)
Triglycerides: 65 mg/dL (ref <150)

## 2023-03-08 LAB — RPR: RPR Ser Ql: NONREACTIVE

## 2023-04-04 ENCOUNTER — Other Ambulatory Visit: Payer: Self-pay | Admitting: Internal Medicine

## 2023-04-04 DIAGNOSIS — B20 Human immunodeficiency virus [HIV] disease: Secondary | ICD-10-CM

## 2023-04-04 NOTE — Telephone Encounter (Signed)
 11 refills were sent 03/2023

## 2023-04-30 ENCOUNTER — Encounter (HOSPITAL_BASED_OUTPATIENT_CLINIC_OR_DEPARTMENT_OTHER): Payer: Self-pay

## 2023-04-30 ENCOUNTER — Emergency Department (HOSPITAL_BASED_OUTPATIENT_CLINIC_OR_DEPARTMENT_OTHER)
Admission: EM | Admit: 2023-04-30 | Discharge: 2023-04-30 | Disposition: A | Discharge status: Home / Self Care | Payer: Self-pay | Attending: Emergency Medicine | Admitting: Emergency Medicine

## 2023-04-30 ENCOUNTER — Other Ambulatory Visit: Payer: Self-pay

## 2023-04-30 DIAGNOSIS — L853 Xerosis cutis: Secondary | ICD-10-CM | POA: Insufficient documentation

## 2023-04-30 DIAGNOSIS — L989 Disorder of the skin and subcutaneous tissue, unspecified: Secondary | ICD-10-CM | POA: Insufficient documentation

## 2023-04-30 MED ORDER — NYSTATIN 100000 UNIT/GM EX POWD: 1.0000 | Freq: Three times a day (TID) | CUTANEOUS | 0 refills | Status: AC

## 2023-04-30 NOTE — ED Notes (Signed)
 Chaperone provider Palumbo to assess patient complaints. At the time of he chaperone no skin redness/ white patchy were noted.

## 2023-04-30 NOTE — ED Provider Notes (Signed)
 [facility] Danny [name] Note   CSN: [id number] Arrival date & time: 04/30/23  0450     History  Chief Complaint  Patient presents with   Personal Problem    Danny [name] is a 28 y.o. adult.  The history is provided by the patient.  Rash Location: genitals. Quality comment:  White patches Severity:  Moderate Onset quality:  Gradual Duration: 3-4 weeks. Also reports dry skin of the palms. Timing:  Constant Progression:  Worsening Chronicity:  New Context: not exposure to similar rash, not insect bite/sting, not medications and not new detergent/soap   Context comment:  No bath products, no vitamins, no over the counter supplements, no travel.  No new deoderants Relieved by:  Nothing Worsened by:  Nothing Ineffective treatments:  None tried Associated symptoms: no periorbital edema   28 year old patient with a 3 week to 1 month history of white areas on the genitals and also dry palms.  No new detergents. No new bath products nor deoderants. No travel.  No OTC supplements: no vitamins or minerals.  No occupational exposures like hand sanitizer or glove use.       Home Medications Prior to Admission medications   Medication Sig Start Date End Date Taking? Authorizing Danny [name]  nystatin (MYCOSTATIN/NYSTOP) powder Apply 1 Application topically 3 (three) times daily. 04/30/23  Yes Danny Schoneman, MD  bictegravir-emtricitabine-tenofovir AF (BIKTARVY) 50-200-25 MG TABS tablet Take 1 tablet by mouth daily. 03/06/23   Danny Barefoot, MD  estradiol valerate (DELESTROGEN) 40 MG/ML injection SMARTSIG:0.3 Milliliter(s) IM Once a Week 01/08/22   Danny [name], Historical, MD      Allergies    Patient has no known allergies.    Review of Systems   Review of Systems  Skin:  Positive for color change.       Dry skin of the palms  All other systems reviewed and are negative.   Physical Exam Updated Vital Signs BP (!) 145/100 (BP Location:  Right Arm)   [name] 100   Temp 98.3 F (36.8 C) (Oral)   Resp 18   SpO2 98%  Physical Exam Vitals and nursing note reviewed. Exam conducted with a chaperone present.  Constitutional:      General: Danny [name] is not in acute distress.    Appearance: Danny [name] is well-developed. Danny [name] is not diaphoretic.  HENT:     Head: Normocephalic and atraumatic.  Eyes:     Conjunctiva/sclera: Conjunctivae normal.     Pupils: Pupils are equal, round, and reactive to light.  Cardiovascular:     Rate and Rhythm: Normal rate and regular rhythm.     Pulses: Normal pulses.     Heart sounds: Normal heart sounds.  Pulmonary:     Effort: Pulmonary effort is normal.     Breath sounds: Normal breath sounds. No wheezing or rales.  Abdominal:     General: Bowel sounds are normal.     Palpations: Abdomen is soft.     Tenderness: There is no abdominal tenderness. There is no guarding or rebound.  Genitourinary:    Penis: Normal.      Testes: Normal.     Comments: No scale, no elevated lesions.  Slight lightening of the skin.   Musculoskeletal:        General: Normal range of motion.     Cervical back: Normal range of motion and neck supple.  Skin:    General: Skin is warm and dry.     Capillary Refill:  Capillary refill takes less than 2 seconds.  Neurological:     General: No focal deficit present.     Mental Status: Danny [name] is alert.     ED Results / Procedures / Treatments   Labs (all labs ordered are listed, but only abnormal results are displayed) Labs Reviewed - No data to display  EKG None  Radiology No results found.  Procedures Procedures    Medications Ordered in ED Medications - No data to display  ED Course/ Medical Decision Making/ A&P                                 Medical Decision Making White lesions of the groin and dry skin of the hands   Amount and/or Complexity of Data Reviewed External Data Reviewed: notes.    Details: Previous notes reviewed    Risk Prescription drug management. Risk Details: The differential of these lesions is: vitiligo, lichen sclerosis and tinea versicolor. I had considered medication side effects, however an exhaustive search of the literature I was unable to link skin findings to medication. Patient had very recent STI testing that was negative. I will treat with nystatin powder.  2 of these diagnoses require dermatology and Internal medicine input.  For the dry skin of the palms I recommend lac hydrin to keep skin of palms hydrated.      Final Clinical Impression(s) / ED Diagnoses Final diagnoses:  Dry skin  Skin lesion   No signs of systemic illness or infection. The patient is nontoxic-appearing on exam and vital signs are within normal limits.  I have reviewed the triage vital signs and the nursing notes. Pertinent labs & imaging results that were available during my care of the patient were reviewed by me and considered in my medical decision making (see chart for details). After history, exam, and medical workup I feel the patient has been appropriately medically screened and is safe for discharge home. Pertinent diagnoses were discussed with the patient. Patient was given return precautions.    Rx / DC Orders ED Discharge Orders          Ordered    nystatin (MYCOSTATIN/NYSTOP) powder  3 times daily        04/30/23 0513              Danny Pillard, MD 04/30/23 8119

## 2023-04-30 NOTE — Discharge Instructions (Signed)
 Lac-hydrin for skin of hands

## 2023-04-30 NOTE — ED Notes (Signed)
 Pt given discharge paperwork with no complaints or questions. Pt/ family understand discharge paperowks and states they have no questions after reviewing paperwork. Pt VS are up to date and WNL.

## 2023-04-30 NOTE — ED Triage Notes (Addendum)
 Pt reports he is here today due to white / red patchy spots on his private area. Pt also reports peeling of hands bilateral. Pt denies any new meds, environment, soaps,foods.Pt denies any recent travels.

## 2023-04-30 NOTE — ED Provider Notes (Signed)
 Patient seems disappointed that I cannot give a definitive diagnosis.  I politely explained with nurse present that the ED is good at diagnosing emergent conditions that have happened over a short period of time.  But there are conditions that need further work up and may be diagnoses of exclusion.  I apologized that we cannot do skin scraping or other testing for the conditions in my differential.  I strongly encouraged patient to follow up with Dr. Luciana Axe as there is also no dermatologist on call for the health care system. I have apologized for this inconvenience.     Terrianna Holsclaw, MD 04/30/23 1610

## 2023-09-10 ENCOUNTER — Ambulatory Visit: Payer: Self-pay | Admitting: Internal Medicine

## 2023-09-10 ENCOUNTER — Ambulatory Visit: Payer: Self-pay | Admitting: Infectious Diseases

## 2023-09-10 ENCOUNTER — Encounter: Payer: Self-pay | Admitting: Infectious Diseases

## 2023-09-10 ENCOUNTER — Other Ambulatory Visit: Payer: Self-pay

## 2023-09-10 VITALS — BP 138/80 | HR 104 | Temp 98.3°F | Ht 67.0 in | Wt 163.0 lb

## 2023-09-10 DIAGNOSIS — Z79899 Other long term (current) drug therapy: Secondary | ICD-10-CM | POA: Insufficient documentation

## 2023-09-10 DIAGNOSIS — Z113 Encounter for screening for infections with a predominantly sexual mode of transmission: Secondary | ICD-10-CM

## 2023-09-10 DIAGNOSIS — Z789 Other specified health status: Secondary | ICD-10-CM | POA: Insufficient documentation

## 2023-09-10 DIAGNOSIS — F640 Transsexualism: Secondary | ICD-10-CM

## 2023-09-10 DIAGNOSIS — B20 Human immunodeficiency virus [HIV] disease: Secondary | ICD-10-CM

## 2023-09-10 NOTE — Progress Notes (Addendum)
 [address], [place], [place], [address]                                                                  Phn. [phone]; Fax: [phone]                                                                             Date: 09/10/23  Reason for Visit: Routine HIV care.  HPI: Danny [name] is a 28 y.o.old adult, transgender male with a history of Chlamydia, gonorrhea,  HIV who is here for regular follow-up.  Patient previously followed by Dr. Efrain and was last seen February 2025.   Interval hx/current visit: Reports being compliant to Biktarvy  without missed doses or concerns. She denies taking any other medications. She was seen in the ED on 4/1 for skin concerns and also recently visited a dermatologist and was diagnosed as eczema, no new concerns. She occasionally consumes wine and is not currently sexually active. She lives with male partner. Works as a Doctor, general practice. Prefers 6 months fu.  Denies any complaints.  ROS: As stated in above HPI; all other systems were reviewed and are otherwise negative unless noted below  No reported fever / chills, night sweats, unintentional weight loss, acute visual change, odynophagia, chest pain/pressure, new or worsened SOB or WOB, nausea, vomiting, diarrhea, dysuria, GU discharge, syncope, seizures, red/hot swollen joints, hallucinations / delusions, rashes, new allergies, unusual / excessive bleeding, swollen lymph nodes, or new hospitalizations since the pt was last seen.  PMH/ PSH/ FamHx / Social Hx , medications and allergies reviewed and updated as appropriate; please see corresponding tab in EHR / prior notes                                        Current Outpatient Medications on File Prior to Visit  Medication Sig Dispense Refill    bictegravir-emtricitabine -tenofovir  AF (BIKTARVY ) 50-200-25 MG TABS tablet Take 1 tablet by mouth daily. 30 tablet 11   estradiol valerate (DELESTROGEN) 40 MG/ML injection SMARTSIG:0.3 Milliliter(s) IM Once a Week     nystatin  (MYCOSTATIN /NYSTOP ) powder Apply 1 Application topically 3 (three) times daily. 15 g 0   No current facility-administered medications on file prior to visit.   No Known Allergies  Past Medical History:  Diagnosis Date   HIV (human immunodeficiency virus infection) (HCC)    No past surgical history on file.  Social History   Socioeconomic History   Marital status: Single    Spouse name: Not on file   Number of  children: Not on file   Years of education: 12   Highest education level: Not on file  Occupational History   Not on file  Tobacco Use   Smoking status: Former    Passive exposure: Never   Smokeless tobacco: Never  Vaping Use   Vaping status: Never Used  Substance and Sexual Activity   Alcohol use: Yes    Comment: occasional   Drug use: Not Currently    Types: Marijuana    Comment: occasional   Sexual activity: Yes    Partners: Male, Male    Comment: condoms declined  Other Topics Concern   Not on file  Social History Narrative   Not on file   Social Drivers of Health   Financial Resource Strain: Not on file  Food Insecurity: Not on file  Transportation Needs: Not on file  Physical Activity: Not on file  Stress: Not on file  Social Connections: Not on file  Intimate Partner Violence: Not on file   No family history on file.  Vitals  BP 138/80   Pulse (!) 104   Temp 98.3 F (36.8 C) (Temporal)   Ht 5' 7 (1.702 m)   Wt 163 lb (73.9 kg)   SpO2 98%   BMI 25.53 kg/m   Examination  Gen: no acute distress HEENT: [place]/AT, no scleral icterus, no pale conjunctivae, hearing normal, oral mucosa moist Neck: Supple Cardio: Regular rate and rhythm, S1 and S2 Resp: Pulmonary effort normal in room air, normal breath sounds GI:  nondistended, nontender and soft GU: Musc: Extremities: No pedal edema Skin: No rashes Neuro: grossly non focal , awake, alert and oriented * 3  Psych: Calm, cooperative  Lab Results HIV 1 RNA Quant (Copies/mL)  Date Value  03/06/2023 Not Detected  09/04/2022 Not Detected  03/06/2022 Not Detected   CD4 T Cell Abs (/uL)  Date Value  03/06/2023 656  09/04/2022 673  03/06/2022 569   Lab Results  Component Value Date   HIV1GENOSEQ REPORT 05/31/2016   Lab Results  Component Value Date   WBC 4.8 03/06/2023   HGB 16.2 03/06/2023   HCT 47.9 03/06/2023   MCV 92.5 03/06/2023   PLT 221 03/06/2023    Lab Results  Component Value Date   CREATININE 0.96 03/06/2023   BUN 14 03/06/2023   NA 138 03/06/2023   K 3.6 03/06/2023   CL 105 03/06/2023   CO2 25 03/06/2023   Lab Results  Component Value Date   ALT 16 03/06/2023   AST 16 03/06/2023   ALKPHOS 59 09/18/2016   BILITOT 0.6 03/06/2023    Lab Results  Component Value Date   CHOL 170 03/06/2023   TRIG 65 03/06/2023   HDL 55 03/06/2023   LDLCALC 100 (H) 03/06/2023   Lab Results  Component Value Date   HAV REACTIVE (A) 05/31/2016   Lab Results  Component Value Date   HEPBSAG NEGATIVE 05/31/2016   HEPBSAB POS (A) 05/31/2016   Lab Results  Component Value Date   HCVAB NEGATIVE 05/31/2016   Lab Results  Component Value Date   CHLAMYDIAWP Negative 03/06/2023   N Negative 03/06/2023   No results found for: GCPROBEAPT Lab Results  Component Value Date   QUANTGOLD NEGATIVE 05/31/2016    Health Maintenance: Immunization History  Administered Date(s) Administered   DTaP 01/08/2001, 02/10/2001, 03/13/2001, 09/10/2001, 03/23/2003   HIB (PRP-OMP) 01/08/2001   HPV 9-valent 10/21/2017, 04/23/2018, 03/04/2019   Hepatitis A 09/20/2006, 07/12/2009   Hepatitis B 01/08/2001, 02/10/2001, 05/09/2001  IPV 01/08/2001, 02/10/2001, 03/13/2001, 03/23/2003   Influenza,inj,Quad PF,6+ Mos 10/21/2017, 03/04/2019    Influenza-Unspecified 04/28/2009, 04/18/2017   MMR 01/08/2001, 03/23/2003   Meningococcal Conjugate 07/12/2009   Meningococcal Mcv4o 05/31/2016   PNEUMOCOCCAL CONJUGATE-20 03/16/2021   Pneumococcal Conjugate-13 10/21/2017   Pneumococcal Polysaccharide-23 06/18/2016   Tdap 05/12/2009   Varicella 01/08/2001, 07/12/2009    Assessment/Plan: # HIV - Continue Biktarvy , refill sent - Labs today - Follow-up in 6 months, prefers 6 months follow-up  # STD screening -Triple GC screen and RPR  # 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 Patients questions were addressed and answered. Safe sex counseling done.  I spent 35 minutes involved in face-to-face and non-face-to-face activities for this patient on the day of the visit. Professional time spent includes the following activities: Preparing to see the patient (review of tests), Obtaining and/or reviewing separately obtained history (ED note on 4/1, prior notes from Dr.Comer), Performing a medically appropriate examination and evaluation , Ordering medications/tests, referring and communicating with other health care professionals, Documenting clinical information in the EMR, Independently interpreting results (not separately reported), Communicating results to the patient, Counseling and educating the patient and Care coordination (not separately reported).   Of note, portions of this note may have been created with voice recognition software. While this note has been edited for accuracy, occasional wrong-word or 'sound-a-like' substitutions may have occurred due to the inherent limitations of voice recognition software.   Electronically signed by:  Annalee Orem, MD Infectious Disease Physician [facility] [address] [place], [place] [address] Phone: [phone]  Fax: [phone]

## 2023-09-11 LAB — CYTOLOGY, (ORAL, ANAL, URETHRAL) ANCILLARY ONLY
Chlamydia: NEGATIVE
Chlamydia: NEGATIVE
Comment: NEGATIVE
Comment: NEGATIVE
Comment: NORMAL
Comment: NORMAL
Neisseria Gonorrhea: NEGATIVE
Neisseria Gonorrhea: NEGATIVE

## 2023-09-11 LAB — URINE CYTOLOGY ANCILLARY ONLY
Chlamydia: NEGATIVE
Comment: NEGATIVE
Comment: NORMAL
Neisseria Gonorrhea: NEGATIVE

## 2023-09-12 LAB — T-HELPER CELLS (CD4) COUNT (NOT AT ARMC)
CD4 % Helper T Cell: 36 % (ref 33–65)
CD4 T Cell Abs: 715 /uL (ref 400–1790)

## 2023-09-13 LAB — COMPREHENSIVE METABOLIC PANEL WITH GFR
AG Ratio: 1.5 (calc) (ref 1.0–2.5)
ALT: 13 U/L (ref 9–46)
AST: 14 U/L (ref 10–40)
Albumin: 4.7 g/dL (ref 3.6–5.1)
Alkaline phosphatase (APISO): 66 U/L (ref 36–130)
BUN: 16 mg/dL (ref 7–25)
CO2: 26 mmol/L (ref 20–32)
Calcium: 9.2 mg/dL (ref 8.6–10.3)
Chloride: 102 mmol/L (ref 98–110)
Creat: 1.01 mg/dL (ref 0.60–1.24)
Globulin: 3.1 g/dL (ref 1.9–3.7)
Glucose, Bld: 108 mg/dL — ABNORMAL HIGH (ref 65–99)
Potassium: 3.5 mmol/L (ref 3.5–5.3)
Sodium: 136 mmol/L (ref 135–146)
Total Bilirubin: 0.4 mg/dL (ref 0.2–1.2)
Total Protein: 7.8 g/dL (ref 6.1–8.1)
eGFR: 106 mL/min/1.73m2 (ref >=60)

## 2023-09-13 LAB — HIV RNA, RTPCR W/R GT (RTI, PI,INT)
HIV 1 RNA Quant: NOT DETECTED {copies}/mL
HIV-1 RNA Quant, Log: NOT DETECTED {Log_copies}/mL

## 2023-09-13 LAB — RPR: RPR Ser Ql: NONREACTIVE

## 2023-09-16 ENCOUNTER — Ambulatory Visit: Payer: Self-pay | Admitting: Infectious Diseases

## 2024-03-17 ENCOUNTER — Ambulatory Visit: Payer: Self-pay | Admitting: Infectious Diseases
